# Patient Record
Sex: Female | Born: 1985 | Race: Black or African American | Hispanic: No | Marital: Single | Smoking: Current some day smoker
Health system: Southern US, Community
[De-identification: ages and names within clinical notes are randomized; demographics above are authoritative.]

## PROBLEM LIST (undated history)

## (undated) ENCOUNTER — Inpatient Hospital Stay (HOSPITAL_COMMUNITY): Payer: Self-pay

## (undated) DIAGNOSIS — I10 Essential (primary) hypertension: Secondary | ICD-10-CM

## (undated) DIAGNOSIS — B009 Herpesviral infection, unspecified: Secondary | ICD-10-CM

## (undated) DIAGNOSIS — IMO0002 Reserved for concepts with insufficient information to code with codable children: Secondary | ICD-10-CM

## (undated) DIAGNOSIS — A599 Trichomoniasis, unspecified: Secondary | ICD-10-CM

## (undated) DIAGNOSIS — Z8619 Personal history of other infectious and parasitic diseases: Secondary | ICD-10-CM

## (undated) DIAGNOSIS — N764 Abscess of vulva: Secondary | ICD-10-CM

## (undated) DIAGNOSIS — R87629 Unspecified abnormal cytological findings in specimens from vagina: Secondary | ICD-10-CM

## (undated) DIAGNOSIS — R87619 Unspecified abnormal cytological findings in specimens from cervix uteri: Secondary | ICD-10-CM

## (undated) DIAGNOSIS — K219 Gastro-esophageal reflux disease without esophagitis: Secondary | ICD-10-CM

## (undated) HISTORY — PX: DILATION AND CURETTAGE OF UTERUS: SHX78

## (undated) HISTORY — DX: Herpesviral infection, unspecified: B00.9

## (undated) HISTORY — DX: Personal history of other infectious and parasitic diseases: Z86.19

## (undated) HISTORY — DX: Abscess of vulva: N76.4

## (undated) HISTORY — DX: Reserved for concepts with insufficient information to code with codable children: IMO0002

## (undated) HISTORY — DX: Unspecified abnormal cytological findings in specimens from cervix uteri: R87.619

## (undated) HISTORY — DX: Unspecified abnormal cytological findings in specimens from vagina: R87.629

---
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
## 2006-03-04 ENCOUNTER — Emergency Department (HOSPITAL_COMMUNITY): Admission: EM | Admit: 2006-03-04 | Discharge: 2006-03-04 | Payer: Self-pay | Admitting: Emergency Medicine

## 2006-03-05 ENCOUNTER — Emergency Department (HOSPITAL_COMMUNITY): Admission: EM | Admit: 2006-03-05 | Discharge: 2006-03-05 | Payer: Self-pay | Admitting: Emergency Medicine

## 2007-10-27 ENCOUNTER — Emergency Department (HOSPITAL_COMMUNITY): Admission: EM | Admit: 2007-10-27 | Discharge: 2007-10-27 | Payer: Self-pay | Admitting: Emergency Medicine

## 2007-12-26 ENCOUNTER — Other Ambulatory Visit: Admission: RE | Admit: 2007-12-26 | Discharge: 2007-12-26 | Payer: Self-pay | Admitting: Obstetrics and Gynecology

## 2008-10-15 ENCOUNTER — Inpatient Hospital Stay (HOSPITAL_COMMUNITY): Admission: AD | Admit: 2008-10-15 | Discharge: 2008-10-15 | Payer: Self-pay | Admitting: Obstetrics & Gynecology

## 2008-10-18 ENCOUNTER — Inpatient Hospital Stay (HOSPITAL_COMMUNITY): Admission: AD | Admit: 2008-10-18 | Discharge: 2008-10-18 | Payer: Self-pay | Admitting: Obstetrics & Gynecology

## 2008-10-21 ENCOUNTER — Inpatient Hospital Stay (HOSPITAL_COMMUNITY): Admission: AD | Admit: 2008-10-21 | Discharge: 2008-10-21 | Payer: Self-pay | Admitting: Obstetrics & Gynecology

## 2008-10-28 ENCOUNTER — Inpatient Hospital Stay (HOSPITAL_COMMUNITY): Admission: AD | Admit: 2008-10-28 | Discharge: 2008-10-28 | Payer: Self-pay | Admitting: Obstetrics & Gynecology

## 2008-11-04 ENCOUNTER — Inpatient Hospital Stay (HOSPITAL_COMMUNITY): Admission: AD | Admit: 2008-11-04 | Discharge: 2008-11-04 | Payer: Self-pay | Admitting: Obstetrics & Gynecology

## 2008-11-11 ENCOUNTER — Inpatient Hospital Stay (HOSPITAL_COMMUNITY): Admission: AD | Admit: 2008-11-11 | Discharge: 2008-11-11 | Payer: Self-pay | Admitting: Obstetrics & Gynecology

## 2008-11-25 ENCOUNTER — Inpatient Hospital Stay (HOSPITAL_COMMUNITY): Admission: AD | Admit: 2008-11-25 | Discharge: 2008-11-25 | Payer: Self-pay | Admitting: Obstetrics & Gynecology

## 2009-03-11 ENCOUNTER — Other Ambulatory Visit: Admission: RE | Admit: 2009-03-11 | Discharge: 2009-03-11 | Payer: Self-pay | Admitting: Obstetrics and Gynecology

## 2009-05-19 ENCOUNTER — Ambulatory Visit (HOSPITAL_COMMUNITY): Admission: RE | Admit: 2009-05-19 | Discharge: 2009-05-19 | Payer: Self-pay | Admitting: Obstetrics & Gynecology

## 2009-06-30 ENCOUNTER — Ambulatory Visit (HOSPITAL_COMMUNITY): Admission: RE | Admit: 2009-06-30 | Discharge: 2009-06-30 | Payer: Self-pay | Admitting: Obstetrics & Gynecology

## 2009-07-21 ENCOUNTER — Ambulatory Visit (HOSPITAL_COMMUNITY): Admission: RE | Admit: 2009-07-21 | Discharge: 2009-07-21 | Payer: Self-pay | Admitting: Obstetrics & Gynecology

## 2009-08-07 ENCOUNTER — Inpatient Hospital Stay (HOSPITAL_COMMUNITY): Admission: AD | Admit: 2009-08-07 | Discharge: 2009-08-07 | Payer: Self-pay | Admitting: Obstetrics & Gynecology

## 2009-08-09 ENCOUNTER — Ambulatory Visit: Payer: Self-pay | Admitting: Obstetrics and Gynecology

## 2009-08-09 ENCOUNTER — Inpatient Hospital Stay (HOSPITAL_COMMUNITY): Admission: AD | Admit: 2009-08-09 | Discharge: 2009-08-09 | Payer: Self-pay | Admitting: Obstetrics & Gynecology

## 2009-10-01 ENCOUNTER — Inpatient Hospital Stay (HOSPITAL_COMMUNITY): Admission: RE | Admit: 2009-10-01 | Discharge: 2009-10-04 | Payer: Self-pay | Admitting: Obstetrics & Gynecology

## 2010-03-15 ENCOUNTER — Other Ambulatory Visit: Admission: RE | Admit: 2010-03-15 | Discharge: 2010-03-15 | Payer: Self-pay | Admitting: Obstetrics and Gynecology

## 2010-12-02 ENCOUNTER — Emergency Department (HOSPITAL_COMMUNITY): Admission: EM | Admit: 2010-12-02 | Discharge: 2010-02-15 | Payer: Self-pay | Admitting: Emergency Medicine

## 2011-03-31 LAB — CBC
HCT: 28.5 % — ABNORMAL LOW (ref 36.0–46.0)
HCT: 30.8 % — ABNORMAL LOW (ref 36.0–46.0)
HCT: 37.5 % (ref 36.0–46.0)
Hemoglobin: 10.3 g/dL — ABNORMAL LOW (ref 12.0–15.0)
MCHC: 34 g/dL (ref 30.0–36.0)
MCV: 94.3 fL (ref 78.0–100.0)
MCV: 94.6 fL (ref 78.0–100.0)
Platelets: 144 10*3/uL — ABNORMAL LOW (ref 150–400)
Platelets: 146 10*3/uL — ABNORMAL LOW (ref 150–400)
Platelets: 177 10*3/uL (ref 150–400)
RBC: 3.02 MIL/uL — ABNORMAL LOW (ref 3.87–5.11)
WBC: 11.5 10*3/uL — ABNORMAL HIGH (ref 4.0–10.5)

## 2011-03-31 LAB — ABO/RH: ABO/RH(D): B POS

## 2011-04-02 LAB — URINALYSIS, ROUTINE W REFLEX MICROSCOPIC
Bilirubin Urine: NEGATIVE
Glucose, UA: NEGATIVE mg/dL
Ketones, ur: NEGATIVE mg/dL
Nitrite: NEGATIVE
Protein, ur: NEGATIVE mg/dL
Specific Gravity, Urine: 1.015 (ref 1.005–1.030)
pH: 7 (ref 5.0–8.0)

## 2011-04-02 LAB — URINE MICROSCOPIC-ADD ON

## 2011-04-02 LAB — URINE CULTURE: Colony Count: 30000

## 2011-04-27 ENCOUNTER — Emergency Department (HOSPITAL_COMMUNITY)
Admission: EM | Admit: 2011-04-27 | Discharge: 2011-04-27 | Disposition: A | Discharge status: Home / Self Care | Payer: 59 | Attending: Emergency Medicine | Admitting: Emergency Medicine

## 2011-04-27 DIAGNOSIS — J020 Streptococcal pharyngitis: Secondary | ICD-10-CM | POA: Insufficient documentation

## 2011-05-13 NOTE — Consult Note (Signed)
Jamie [name], REAUME NO.:  [id number]   MEDICAL RECORD NO.:  [id number]          PATIENT TYPE:  EMS   LOCATION:  ED                            FACILITY:  [facility]   PHYSICIAN:  Tilda Burrow, M.D. DATE OF BIRTH:  1986-09-07   DATE OF CONSULTATION:  DATE OF DISCHARGE:  03/05/2006                                   CONSULTATION   [place]   CHIEF COMPLAINT:  Bleeding at [redacted] weeks gestation.   HISTORY OF PRESENT ILLNESS:  This 25 year old female gravida 4, para 1,  elected AB x2, 1 prior successful vaginal delivery in 2004 with elective  terminations of pregnancy once previously and once after that pregnancy, is  seen in the emergency room complaining of bleeding.  She has known that she  was pregnant since mid February, as her last menstrual period was late  January.  She has not sought pregnancy care.  She goes to Dr. Gerda Diss for  general medical care.  She presents for the second time in 24 hours  complaining of a vaginal bleeding per ER records.   PAST MEDICAL HISTORY:  Benign.   SURGICAL HISTORY:  Negative.  Review of old records indicate the patient is  blood type B positive.   EXAMINATION:  SPECULUM EXAM:  Heavy dark blood in the vaginal vault with  some obvious tissue in the vault.  This is extracted and sent as a specimen.  The cervix is closed and the uterus is anteflexed within upper limits normal  size.   Transvaginal ultrasound was performed by Jannifer Franklin, M.D. in the ER,  revealing a normal size uterus with a 9 mm endometrial stripe.  There is  some thickening of the tissue in the mid portion of the endometrium.  It  appears that there is very little tissue in the uppermost portions of the  uterine fundus.  It is my impression that she has already separated off most  of the pregnancy tissue, but there is plenty of decidual left inside the  uterus.  There may be some residual placental tissue as well.   Options discussed with patient.  Option proceeding with a D&C in the a.m. to  completely evacuate uterus discussed with patient versus letting her  spontaneously expel the remaining tissue, which is a relatively small  amount.  The patient feels she can comfortably handle spontaneous  miscarriage completion at home.  She is, therefore, given Tylenol #3  prescription 1 tablet to 2 tablets every 4 hours p.r.n. pain, also advised  to take doxycycline 100 mg p.o. b.i.d. x3 days.  Followup in 3 days at  [facility] for reassessment.  If resolution of bleeding has not  occurred, the patient may be considered at that time for a D&C.   ADDENDUM:  The patient desires to use Depo-Provera contraception in the  future and may receive her first shot at followup if needed.      Tilda Burrow, M.D.  Electronically Signed     JVF/MEDQ  D:  03/05/2006  T:  03/06/2006  Job:  [id number]  cc:   [facility]  [address]  [place], [place] [address]   Scott A. Gerda Diss, MD  Fax: [phone]

## 2011-05-13 NOTE — Op Note (Signed)
NAME:  Jamie [name], Jamie [name]                    ACCOUNT NO.:  [id number]   MEDICAL RECORD NO.:  [id number]                   PATIENT TYPE:  INP   LOCATION:  A427                                 FACILITY:  [facility]   PHYSICIAN:  Lazaro Arms, M.D.                DATE OF BIRTH:  12-Jun-1986   DATE OF PROCEDURE:  09/19/2003  DATE OF DISCHARGE:                                 OPERATIVE REPORT   INDICATIONS FOR PROCEDURE:  We were called on a stat basis to [facility]  labor and delivery room because of fetal heart rate that went down into the  50s to 70 range and remained down there.  It responded to change in position  and O2 and fluids and cutting off the Pitocin; however, it dropped back down  again into that low range and stayed there.  The baby was at +2 station, and  I tried and vacuum and forceps delivery for approximately 10 minutes.  This  was approximately at 10 minutes to 4 p.m. or 1550.  When I was unable to  deliver the baby vaginally, I made the decision to do a stat C-section.  The  patient had an epidural which was working well.  We took her down to the  operating room and splashed Betadine on her belly and did the C-section in a  stat fashion right there in the labor and delivery bed.   Again, I was called and basically arrived at 1550.  We went down to the OR  at 1600, and the infant was delivered at 1615, with Dr. Milford Cage present.   PREOPERATIVE DIAGNOSES:  1. Intrauterine pregnancy at 39-weeks gestation.  2. Chronic hypertension.  3. Unresponsive fetal bradycardic episode in the 50-70 range.  4. Failed vacuum with forceps vaginal delivery attempt.   POSTOPERATIVE DIAGNOSES:  1. Intrauterine pregnancy at 39-weeks gestation.  2. Chronic hypertension.  3. Unresponsive fetal bradycardic episode in the 50-70 range.  4. Failed vacuum with forceps vaginal delivery attempt.  5. Occiput posterior presentation.   PROCEDURE:  Stat cesarean section.   SURGEON:  Lazaro Arms,  M.D.   ANESTHESIA:  Epidural.   FINDINGS:  As above.   DESCRIPTION OF PROCEDURE:  Again, the patient was taken down in stat fashion  to the operating room.  Betadine was splashed on the belly.   A Pfannenstiel skin incision was made and carried down sharply to the rectus  fascia, scored in the midline, and extended laterally.  A uterine incision  was then made after the peritoneal cavity was entered, and the infant was  delivered at 1615 with Apgars of 5, 7, and 9.  The cord gas and cord blood  were sent.  Cord gas returned showing basically a pure respiratory  component.  The pH was 7.07, PCO2 72, bicarb 20, and a base deficit of 8.7  which points to a respiratory acidosis or an acute acidotic state.  We  then  draped the patient out after this point and gave her Ancef IV.  The uterus  was closed in two layers, the first being a running interlocking layer and  the second being an imbricating layer.  The placenta was sent to pathology  for evaluation.  There was good hemostasis.  The uterus was replaced in the  peritoneal cavity.  Both pericolic gutters were irrigated.  The muscles were  reapproximated loosely.  The fascia was closed using 0 Vicryl running.  The  subcutaneous tissue was irrigated and made hemostatic.  The skin was closed  using skin staples.   The patient tolerated the procedure well.  She experienced 500 cc of blood  loss and was taken to the recovery room in good and stable condition.  All  counts were correct.      ___________________________________________                                            Lazaro Arms, M.D.   LHE/MEDQ  D:  10/30/2003  T:  10/30/2003  Job:  [id number]

## 2011-05-13 NOTE — Op Note (Signed)
   NAME:  Jamie [name], Jamie [name]                    ACCOUNT NO.:  [id number]   MEDICAL RECORD NO.:  [id number]                   PATIENT TYPE:  INP   LOCATION:  LDR1                                 FACILITY:  [facility]   PHYSICIAN:  Lazaro Arms, M.D.                DATE OF BIRTH:  1986/04/07   DATE OF PROCEDURE:  09/19/2003  DATE OF DISCHARGE:                                 OPERATIVE REPORT   PROCEDURE:  Epidural.   INDICATIONS FOR PROCEDURE:  Jamie [name] is a gravida 1, para 0 at [redacted] weeks  gestation with chronic hypertension.  She is in early labor and requesting  an epidural.   DESCRIPTION OF PROCEDURE:  The patient was placed in the seating position.  Her iliac crest were identified.  The spinous processes were identified.  The L2-L3 interspace was identified and marked.  The area was prepped and  draped.  Lidocaine 1% was used as a local anesthetic.  A 17-gauge Tuohy  needle was used and a loss of resistance technique employed.  With one pass,  the epidural space was found without difficulty, and 10 cc of 0.125%  bupivacaine was given through the needle as a test dose with no ill effects.  The epidural catheter was threaded easily 5 cm from the epidural space.  An  additional 10 cc of 0.125% bupivacaine and 5 cc of 1.5% epinephrine with  1:200,000 epinephrine was given to dose up the epidural.  It was then taped  down 5 cm from the epidural space or 12 cm at the skin.  The patient  tolerated the procedure well.  She was having no ill effects and is  comfortable.  The blood pressure is stable, and the fetal heart rate tracing  is reactive.      ___________________________________________                                            Lazaro Arms, M.D.   LHE/MEDQ  D:  09/19/2003  T:  09/19/2003  Job:  [id number]

## 2011-05-13 NOTE — H&P (Signed)
   NAME:  [name], Jamie NO.:  [id number]   MEDICAL RECORD NO.:  [id number]                   PATIENT TYPE:   LOCATION:                                       FACILITY:   PHYSICIAN:  Lazaro Arms, M.D.                DATE OF BIRTH:   DATE OF ADMISSION:  09/18/2003  DATE OF DISCHARGE:                                HISTORY & PHYSICAL   HISTORY OF PRESENT ILLNESS:  Jamie [name] is a 25 year old African American  female, gravida 2, para 0, abortion 1, with estimated date of delivery of  September 24, 2003 by last menstrual period and confirmatory ten week  sonogram, currently at 39-weeks gestation, who is admitted for induction of  labor.  She has chronic hypertension and has been on blood pressure medicine  almost the entire pregnancy beginning April 08, 2003.  She currently is on  Aldomet 500 mg b.i.d. She has been getting twice weekly antepartum fetal  testing with good results.  Today, her cervix is 2- to -3-cm, 50% effaced,  and -2 station, vertex, soft in mid plane.  Preparations were made for Foley  bulb insertion and induction this coming Thursday and Friday.   PAST MEDICAL HISTORY:  Chronic hypertension.   PAST SURGICAL HISTORY:  Negative.   PAST OB HISTORY:  Pregnancy loss.   ALLERGIES:  None.   MEDICATIONS:  Prenatal vitamins.   Blood type is B positive.  Antibody screen is negative.  Rubella is immune.  Hepatitis B is negative.  HIV is negative.  Serology is nonreactive.  Pap  was normal.  GC and Chlamydia were negative.  AFP was normal.  Group B strep  was negative.  Glucola was 71.   The patient is indeed interested in an epidural for pain management.   PHYSICAL EXAMINATION:  HEENT:  Unremarkable.  Thyroid is normal.  LUNGS:  Clear.  HEART:  Regular rhythm without regurg or gallop.  BREASTS:  Deferred.  ABDOMEN:  Fundal height of 37-cm.  Cervix is as above.  EXTREMITIES:  Have 1+ edema.  NEUROLOGIC:  Grossly intact.   IMPRESSION:  1. Intrauterine pregnancy at 39-weeks gestation.  2.     Chronic hypertension.  3. Favorable cervix.   PLAN:  Patient admitted for cervical ripening, induction of labor.  She will  be there on the 23rd at 5 p.m.     ___________________________________________                                         Lazaro Arms, M.D.   LHE/MEDQ  D:  09/16/2003  T:  09/16/2003  Job:  [id number]

## 2011-05-13 NOTE — Discharge Summary (Signed)
NAME:  Jamie [name], Jamie [name]                    ACCOUNT NO.:  [id number]   MEDICAL RECORD NO.:  [id number]                   PATIENT TYPE:  INP   LOCATION:  A427                                 FACILITY:  [facility]   PHYSICIAN:  Lazaro Arms, M.D.                DATE OF BIRTH:  03-06-1986   DATE OF ADMISSION:  09/18/2003  DATE OF DISCHARGE:  09/23/2003                                 DISCHARGE SUMMARY   DISCHARGE DIAGNOSES:  1. Status post an emergency cesarean section.  2. Fetal bradycardic episode.  3. Anemia.  4. Failed vaginal forceps in vacuum extractor delivery.   PROCEDURES:  1. Induction of labor due to chronic hypertension at 39 weeks.  2. Failed operative vaginal delivery.  3. Stat C-section.   HISTORY AND PHYSICAL:  Please refer to the History and Physical for details  of admission to the hospital.   HOSPITAL COURSE:  The patient was admitted to undergo Foley bulb dilation of  the cervix and induction of and induction of labor caused chronic  hypertension at 39 weeks.  The induction went well without any problems.  Fetal bradycardic episode at about 1540 on September 19, 2003.  The patient  underwent an attempted vaginal delivery without success.  Taken down for a  stat C-section.  Delivered a viable female infant with Apgars of 5, 7 and 9.  Dr. Milford Cage was in attendance.  The patient's postoperative course was  remarkably unremarkable considering she really did not have a full prep.  She remained afebrile, tolerated clear liquids and a regular diet.  Voided  without symptoms.  She was more anemic than I expected.  She dropped down to  7 and 20.8 by postop day #2 and 6.8 to 20.4 on postop day #3.  However, she  was tolerating it.  She was ambulatory.  Her incision was clean, dry and  intact.  She was requiring minimal pain medications.  The infant had a  forceps abrasion on the left cheek area and had what was thought to be third  and seventh nerve weakness after delivery  that was showing some signs of  resolution.  Also, had some facial edema on the left, possibly from  positioning in the pelvis.  The infant, otherwise, did quite well.  The  patient was discharged to home postop day #3 in good, stable condition.   FOLLOWUP:  Follow up in the office in three weeks.  She was given  instructions and precautions for return.   MEDICATIONS:  1. Levaquin as antibiotics.  2. Tylox for pain.     ___________________________________________                                         Lazaro Arms, M.D.   LHE/MEDQ  D:  10/30/2003  T:  10/30/2003  Job:  [id number]  

## 2011-09-27 LAB — HCG, QUANTITATIVE, PREGNANCY
hCG, Beta Chain, Quant, S: 1291 — ABNORMAL HIGH
hCG, Beta Chain, Quant, S: 2014 — ABNORMAL HIGH
hCG, Beta Chain, Quant, S: 11 — ABNORMAL HIGH
hCG, Beta Chain, Quant, S: 45 — ABNORMAL HIGH

## 2012-01-17 ENCOUNTER — Other Ambulatory Visit: Payer: Self-pay

## 2012-01-17 ENCOUNTER — Other Ambulatory Visit (HOSPITAL_COMMUNITY)
Admission: RE | Admit: 2012-01-17 | Discharge: 2012-01-17 | Disposition: A | Discharge status: Home / Self Care | Payer: 59 | Source: Ambulatory Visit | Attending: Unknown Physician Specialty | Admitting: Unknown Physician Specialty

## 2012-01-17 DIAGNOSIS — N870 Mild cervical dysplasia: Secondary | ICD-10-CM | POA: Insufficient documentation

## 2012-01-17 DIAGNOSIS — B977 Papillomavirus as the cause of diseases classified elsewhere: Secondary | ICD-10-CM | POA: Insufficient documentation

## 2012-01-17 DIAGNOSIS — Z01419 Encounter for gynecological examination (general) (routine) without abnormal findings: Secondary | ICD-10-CM | POA: Insufficient documentation

## 2012-03-20 ENCOUNTER — Encounter (HOSPITAL_COMMUNITY): Payer: Self-pay | Admitting: *Deleted

## 2012-03-20 ENCOUNTER — Emergency Department (HOSPITAL_COMMUNITY)
Admission: EM | Admit: 2012-03-20 | Discharge: 2012-03-20 | Disposition: A | Discharge status: Home / Self Care | Payer: 59 | Attending: Emergency Medicine | Admitting: Emergency Medicine

## 2012-03-20 DIAGNOSIS — R197 Diarrhea, unspecified: Secondary | ICD-10-CM | POA: Insufficient documentation

## 2012-03-20 DIAGNOSIS — R112 Nausea with vomiting, unspecified: Secondary | ICD-10-CM | POA: Insufficient documentation

## 2012-03-20 DIAGNOSIS — F17200 Nicotine dependence, unspecified, uncomplicated: Secondary | ICD-10-CM | POA: Insufficient documentation

## 2012-03-20 LAB — PREGNANCY, URINE: Preg Test, Ur: NEGATIVE

## 2012-03-20 LAB — URINALYSIS, ROUTINE W REFLEX MICROSCOPIC
Leukocytes, UA: NEGATIVE
Protein, ur: NEGATIVE mg/dL
Urobilinogen, UA: 0.2 mg/dL (ref 0.0–1.0)

## 2012-03-20 MED ORDER — ONDANSETRON 8 MG PO TBDP
8.0000 mg | ORAL_TABLET | ORAL | Status: AC
  Administered 2012-03-20: 8 mg via ORAL
  Filled 2012-03-20: qty 1

## 2012-03-20 MED ORDER — ONDANSETRON HCL 4 MG PO TABS: 4.0000 mg | ORAL_TABLET | Freq: Four times a day (QID) | ORAL | Status: AC

## 2012-03-20 NOTE — ED Provider Notes (Signed)
History   This chart was scribed for Jamie Chick, MD by Brooks Sailors. The patient was seen in room APA03/APA03. Patient's care was started at 1235.   CSN: [id number]  Arrival date & time 03/20/12  1235   First MD Initiated Contact with Patient 03/20/12 1402      Chief Complaint  Patient presents with  . Nausea  . Emesis  . Diarrhea    (Consider location/radiation/quality/duration/timing/severity/associated sxs/prior treatment) HPI  Jamie [name] is a 26 y.o. female who presents to the Emergency Department complaining of constant moderate n/v/d onset this morning 6 hours ago and improving since with associated chills. Describes emesis as watery and clear. Denies fever. Patient notes contact with sick family exhibiting similar symptoms. Patient is smoker 0.5 packs a day.  Emesis nonbloody and nonbilious, no blood in stool.  Denies abdominal pain.  There are no other associated systemic symptoms.  Pt has been able to tolerate sprite prior to ED evaluation.  There are no alleviating or modifying factors.      History reviewed. No pertinent past medical history.  History reviewed. No pertinent past surgical history.  No family history on file.  History  Substance Use Topics  . Smoking status: Current Everyday Smoker -- 0.5 packs/day    Types: Cigarettes  . Smokeless tobacco: Not on file  . Alcohol Use: Yes     occasionally    OB History    Grav Para Term Preterm Abortions TAB SAB Ect Mult Living                  Review of Systems 10 Systems reviewed and are negative for acute change except as noted in the HPI.  Allergies  Review of patient's allergies indicates no known allergies.  Home Medications   Current Outpatient Rx  Name Route Sig Dispense Refill  . MICONAZOLE NITRATE 1200-2 MG-% VA KIT Vaginal Place 1 each vaginally once.    Marland Kitchen ONDANSETRON HCL 4 MG PO TABS Oral Take 1 tablet (4 mg total) by mouth every 6 (six) hours. 12 tablet 0    BP 117/76   Pulse 107  Temp(Src) 98.5 F (36.9 C) (Oral)  Resp 16  Ht 5\' 1"  (1.549 m)  Wt 152 lb (68.947 kg)  BMI 28.72 kg/m2  SpO2 100%  LMP 02/14/2012  Physical Exam  Nursing note and vitals reviewed. Constitutional: She is oriented to person, place, and time. She appears well-developed and well-nourished. No distress.  HENT:  Head: Normocephalic and atraumatic.       Tongue mild dryness  Eyes: EOM are normal. Pupils are equal, round, and reactive to light.  Neck: Neck supple. No tracheal deviation present.  Cardiovascular: Normal rate, regular rhythm and normal heart sounds.  Exam reveals no gallop and no friction rub.   No murmur heard. Pulmonary/Chest: Effort normal. No respiratory distress. She has no wheezes. She has no rales.  Abdominal: Soft. Bowel sounds are normal. She exhibits no distension. There is no tenderness.  Musculoskeletal: Normal range of motion. She exhibits no edema.  Neurological: She is alert and oriented to person, place, and time. No sensory deficit.  Skin: Skin is warm and dry.  Psychiatric: She has a normal mood and affect. Her behavior is normal.    ED Course  Procedures (including critical care time) DIAGNOSTIC STUDIES: Oxygen Saturation is 100% on room air, normal by my interpretation.    COORDINATION OF CARE: 2:14PM Patient informed of current plan for treatment and evaluation and  agrees with plan at this time.     Labs Reviewed  URINALYSIS, ROUTINE W REFLEX MICROSCOPIC  PREGNANCY, URINE   No results found.   1. Nausea vomiting and diarrhea       MDM  Patient presenting with acute onset of nausea vomiting and diarrhea this morning. She has no abdominal pain and abdominal exam is benign. She is tolerating Sprite prior to arrival in the ED period her urinalysis is reassuring. She was given a DT Zofran in the ED with improvement of symptoms. She is drinking fluids in the ED as well. She was discharged with strict return precautions and patient is  agreeable with this plan. Prior chart was reviewed and considered during this visit.    I personally performed the services described in this documentation, which was scribed in my presence. The recorded information has been reviewed and considered.       Jamie Chick, MD 03/20/12 [phone]

## 2012-03-20 NOTE — ED Notes (Signed)
Pt presents with c/o N/V/Diarrhea since this morning. Pt denies fever at this time. No acute distress noted.

## 2012-03-20 NOTE — ED Notes (Signed)
C/o n/v/d x 1 day. Reports that she has not vomited in last several hours, but that the diarrhea is still occuring. Patient informed urine specimen is needed.

## 2012-03-20 NOTE — Discharge Instructions (Signed)
Return to the ED with any concerns including abdominal pain, fever, vomiting and not able to keep down liquids, decreased level of alertness or lethargy, fainting, or any other alarming symptoms.

## 2012-04-06 ENCOUNTER — Emergency Department (HOSPITAL_COMMUNITY)
Admission: EM | Admit: 2012-04-06 | Discharge: 2012-04-06 | Disposition: A | Discharge status: Home / Self Care | Payer: 59 | Attending: Emergency Medicine | Admitting: Emergency Medicine

## 2012-04-06 DIAGNOSIS — R197 Diarrhea, unspecified: Secondary | ICD-10-CM

## 2012-04-06 DIAGNOSIS — R1110 Vomiting, unspecified: Secondary | ICD-10-CM | POA: Insufficient documentation

## 2012-04-06 LAB — URINE MICROSCOPIC-ADD ON

## 2012-04-06 LAB — URINALYSIS, ROUTINE W REFLEX MICROSCOPIC
Bilirubin Urine: NEGATIVE
Ketones, ur: NEGATIVE mg/dL
Nitrite: NEGATIVE
Urobilinogen, UA: 0.2 mg/dL (ref 0.0–1.0)

## 2012-04-06 MED ORDER — DIPHENOXYLATE-ATROPINE 2.5-0.025 MG PO TABS
2.0000 | ORAL_TABLET | Freq: Once | ORAL | Status: AC
  Administered 2012-04-06: 2 via ORAL
  Filled 2012-04-06: qty 2

## 2012-04-06 MED ORDER — ONDANSETRON HCL 4 MG/2ML IJ SOLN
4.0000 mg | Freq: Once | INTRAMUSCULAR | Status: AC
  Administered 2012-04-06: 4 mg via INTRAVENOUS
  Filled 2012-04-06: qty 2

## 2012-04-06 MED ORDER — MORPHINE SULFATE 2 MG/ML IJ SOLN
2.0000 mg | Freq: Once | INTRAMUSCULAR | Status: AC
  Administered 2012-04-06: 2 mg via INTRAVENOUS
  Filled 2012-04-06: qty 1

## 2012-04-06 MED ORDER — SODIUM CHLORIDE 0.9 % IV SOLN
Freq: Once | INTRAVENOUS | Status: AC
  Administered 2012-04-06: 03:00:00 via INTRAVENOUS

## 2012-04-06 MED ORDER — SODIUM CHLORIDE 0.9 % IV BOLUS (SEPSIS)
1000.0000 mL | Freq: Once | INTRAVENOUS | Status: AC
  Administered 2012-04-06: 1000 mL via INTRAVENOUS

## 2012-04-06 MED ORDER — PROMETHAZINE HCL 25 MG PO TABS: 12.5000 mg | ORAL_TABLET | Freq: Four times a day (QID) | ORAL | Status: DC | PRN

## 2012-04-06 NOTE — Discharge Instructions (Signed)
Drink lots of fluids.  Bland diet for the next 6-8 hours then progress as you can tolerate. Use the nausea medicine as needed. Use BRAT for diarrhea. Follow up with your doctor.   B.R.A.T. Diet Your doctor has recommended the B.R.A.T. diet for you or your child until the condition improves. This is often used to help control diarrhea and vomiting symptoms. If you or your child can tolerate clear liquids, you may have:  Bananas.   Rice.   Applesauce.   Toast (and other simple starches such as crackers, potatoes, noodles).  Be sure to avoid dairy products, meats, and fatty foods until symptoms are better. Fruit juices such as apple, grape, and prune juice can make diarrhea worse. Avoid these. Continue this diet for 2 days or as instructed by your caregiver. Document Released: 12/12/2005 Document Revised: 12/01/2011 Document Reviewed: 05/31/2007 [employer] Patient Information 2012 [employer]. 

## 2012-04-06 NOTE — ED Provider Notes (Signed)
History     CSN: [id number]  Arrival date & time 04/06/12  0117   First MD Initiated Contact with Patient 04/06/12 0153      Chief Complaint  Patient presents with  . Emesis  . Diarrhea    (Consider location/radiation/quality/duration/timing/severity/associated sxs/prior treatment) HPI Jamie [name] is a 26 y.o. female who presents to the Emergency Department complaining of vomiting and diarrhea that began early yesterday morning. She only had 2 episodes of vomiting but has had multiple episodes of diarrhea. Diarrhea is associated with some lower abdominal cramping. She denies fever, chills, shortness of breath, cough, recent antibiotic therapy. She has taken no medicines for the current condition. No past medical history on file.  No past surgical history on file.  No family history on file.  History  Substance Use Topics  . Smoking status: Current Everyday Smoker -- 0.5 packs/day    Types: Cigarettes  . Smokeless tobacco: Not on file  . Alcohol Use: Yes     occasionally    OB History    Grav Para Term Preterm Abortions TAB SAB Ect Mult Living                  Review of Systems ROS: Statement: All systems negative except as marked or noted in the HPI; Constitutional: Negative for fever and chills. ; ; Eyes: Negative for eye pain, redness and discharge. ; ; ENMT: Negative for ear pain, hoarseness, nasal congestion, sinus pressure and sore throat. ; ; Cardiovascular: Negative for chest pain, palpitations, diaphoresis, dyspnea and peripheral edema. ; ; Respiratory: Negative for cough, wheezing and stridor. ; ; Gastrointestinal: positive for  nausea, vomiting, diarrhea,   abdominal pain, negative for  blood in stool, hematemesis, jaundice and rectal bleeding. . ; ; Genitourinary: Negative for dysuria, flank pain and hematuria. ; ; Musculoskeletal: Negative for back pain and neck pain. Negative for swelling and trauma.; ; Skin: Negative for pruritus, rash, abrasions,  blisters, bruising and skin lesion.; ; Neuro: Negative for headache, lightheadedness and neck stiffness. Negative for weakness, altered level of consciousness , altered mental status, extremity weakness, paresthesias, involuntary movement, seizure and syncope.     Allergies  Review of patient's allergies indicates no known allergies.  Home Medications   Current Outpatient Rx  Name Route Sig Dispense Refill  . MICONAZOLE NITRATE 1200-2 MG-% VA KIT Vaginal Place 1 each vaginally once.      BP 126/80  Pulse 94  Temp(Src) 99.4 F (37.4 C) (Oral)  Resp 20  Ht 5\' 1"  (1.549 m)  Wt 150 lb (68.04 kg)  BMI 28.34 kg/m2  SpO2 96%  LMP 03/26/2012  Physical Exam Physical examination:  Nursing notes reviewed; Vital signs and O2 SAT reviewed;  Constitutional: Well developed, Well nourished, Well hydrated, In no acute distress; Head:  Normocephalic, atraumatic; Eyes: EOMI, PERRL, No scleral icterus; ENMT: Mouth and pharynx normal, Mucous membranes moist; Neck: Supple, Full range of motion, No lymphadenopathy; Cardiovascular: Regular rate and rhythm, No murmur, rub, or gallop; Respiratory: Breath sounds clear & equal bilaterally, No rales, rhonchi, wheezes, or rub, Normal respiratory effort/excursion; Chest: Nontender, Movement normal; Abdomen: Soft, Nontender, Nondistended, Normal bowel sounds; Genitourinary: No CVA tenderness; Extremities: Pulses normal, No tenderness, No edema, No calf edema or asymmetry.; Neuro: AA&Ox3, Major CN grossly intact.  No gross focal motor or sensory deficits in extremities.; Skin: Color normal, Warm, Dry  ED Course  Procedures (including critical care time) Results for orders placed during the hospital encounter of 03/20/12  URINALYSIS,  ROUTINE W REFLEX MICROSCOPIC      Component Value Range   Color, Urine YELLOW  YELLOW    APPearance CLEAR  CLEAR    Specific Gravity, Urine 1.025  1.005 - 1.030    pH 6.0  5.0 - 8.0    Glucose, UA NEGATIVE  NEGATIVE (mg/dL)   Hgb  urine dipstick NEGATIVE  NEGATIVE    Bilirubin Urine NEGATIVE  NEGATIVE    Ketones, ur NEGATIVE  NEGATIVE (mg/dL)   Protein, ur NEGATIVE  NEGATIVE (mg/dL)   Urobilinogen, UA 0.2  0.0 - 1.0 (mg/dL)   Nitrite NEGATIVE  NEGATIVE    Leukocytes, UA NEGATIVE  NEGATIVE   PREGNANCY, URINE      Component Value Range   Preg Test, Ur NEGATIVE  NEGATIVE       MDM  Patient with vomiting and diarrhea that began yesterday morning. Given IV fluids, antibiotics, analgesics, Lomotil with improvement. Patient able to take by mouth fluids.no diarrhea since arrival in the ER.Pt stable in ED with no significant deterioration in condition.The patient appears reasonably screened and/or stabilized for discharge and I doubt any other medical condition or other [facility] requiring further screening, evaluation, or treatment in the ED at this time prior to discharge.  MDM Reviewed: nursing note and vitals Interpretation: labs           Nicoletta Dress. Colon Branch, MD 04/06/12 [phone]

## 2012-04-06 NOTE — ED Notes (Signed)
Vomiting and diarrhea since Thurs am

## 2012-09-02 ENCOUNTER — Encounter (HOSPITAL_COMMUNITY): Payer: Self-pay | Admitting: *Deleted

## 2012-09-02 ENCOUNTER — Emergency Department (HOSPITAL_COMMUNITY)
Admission: EM | Admit: 2012-09-02 | Discharge: 2012-09-02 | Disposition: A | Discharge status: Home / Self Care | Payer: 59 | Attending: Emergency Medicine | Admitting: Emergency Medicine

## 2012-09-02 DIAGNOSIS — F17200 Nicotine dependence, unspecified, uncomplicated: Secondary | ICD-10-CM | POA: Insufficient documentation

## 2012-09-02 DIAGNOSIS — H9209 Otalgia, unspecified ear: Secondary | ICD-10-CM

## 2012-09-02 NOTE — ED Notes (Signed)
Discharge instructions reviewed with pt, questions answered. Pt verbalized understanding.  

## 2012-09-02 NOTE — ED Provider Notes (Signed)
History     CSN: [id number]  Arrival date & time 09/02/12  1227   None     Chief Complaint  Patient presents with  . Sore Throat    (Consider location/radiation/quality/duration/timing/severity/associated sxs/prior treatment) HPI Comments: Sore throat, B ear pain, subjective fever.  No cough.    Patient is a 26 y.o. female presenting with pharyngitis. The history is provided by the patient. No language interpreter was used.  Sore Throat This is a new problem. The problem has been gradually improving. Associated symptoms include a fever and a sore throat. Pertinent negatives include no chills or coughing. The symptoms are aggravated by swallowing. She has tried nothing for the symptoms.    History reviewed. No pertinent past medical history.  History reviewed. No pertinent past surgical history.  No family history on file.  History  Substance Use Topics  . Smoking status: Current Everyday Smoker -- 0.5 packs/day    Types: Cigarettes  . Smokeless tobacco: Not on file  . Alcohol Use: Yes     occasionally    OB History    Grav Para Term Preterm Abortions TAB SAB Ect Mult Living                  Review of Systems  Constitutional: Positive for fever. Negative for chills.  HENT: Positive for ear pain and sore throat.   Respiratory: Negative for cough.   All other systems reviewed and are negative.    Allergies  Review of patient's allergies indicates no known allergies.  Home Medications   Current Outpatient Rx  Name Route Sig Dispense Refill  . PSEUDOEPH-CPM-DM-APAP 30-2-15-325 MG PO TABS Oral Take 2 tablets by mouth every 8 (eight) hours as needed. Cold Symptoms      BP 130/83  Pulse 65  Temp 98.1 F (36.7 C) (Oral)  Resp 18  Ht 5\' 1"  (1.549 m)  Wt 146 lb 8 oz (66.452 kg)  BMI 27.68 kg/m2  SpO2 100%  LMP 08/14/2012  Physical Exam  Nursing note and vitals reviewed. Constitutional: She is oriented to person, place, and time. She appears well-developed  and well-nourished. No distress.  HENT:  Head: Normocephalic and atraumatic.  Right Ear: Hearing, tympanic membrane, external ear and ear canal normal.  Left Ear: Hearing, tympanic membrane, external ear and ear canal normal.  Mouth/Throat: Uvula is midline and mucous membranes are normal. No uvula swelling. Posterior oropharyngeal erythema present. No oropharyngeal exudate, posterior oropharyngeal edema or tonsillar abscesses.  Eyes: EOM are normal.  Neck: Normal range of motion.  Cardiovascular: Normal rate, regular rhythm and normal heart sounds.   Pulmonary/Chest: Breath sounds normal. No accessory muscle usage. Not tachypneic. No respiratory distress. She has no decreased breath sounds. She has no wheezes. She has no rhonchi. She has no rales.  Abdominal: Soft. She exhibits no distension. There is no tenderness.  Musculoskeletal: Normal range of motion.  Lymphadenopathy:       Right cervical: No superficial cervical, no deep cervical and no posterior cervical adenopathy present.      Left cervical: No superficial cervical, no deep cervical and no posterior cervical adenopathy present.  Neurological: She is alert and oriented to person, place, and time.  Skin: Skin is warm and dry.  Psychiatric: She has a normal mood and affect. Judgment normal.    ED Course  Procedures (including critical care time)   Labs Reviewed  RAPID STREP SCREEN   No results found.   1. Ear pain  MDM  Strep neg No s/s of AOM         Evalina Field, PA 09/02/12 1401

## 2012-09-02 NOTE — ED Notes (Signed)
Pt also requesting to have right wrist "checked" pain to right wrist area for a month, denies any injury.

## 2012-09-02 NOTE — ED Notes (Signed)
C/o sore throat for two days, denies any fever, some diarrhea yesterday, but it is better today

## 2012-09-02 NOTE — ED Provider Notes (Signed)
Medical screening examination/treatment/procedure(s) were performed by non-physician practitioner and as supervising physician I was immediately available for consultation/collaboration.   Lyanne Co, MD 09/02/12 [phone]

## 2013-02-08 ENCOUNTER — Emergency Department (HOSPITAL_COMMUNITY)
Admission: EM | Admit: 2013-02-08 | Discharge: 2013-02-08 | Disposition: A | Discharge status: Home / Self Care | Payer: Self-pay | Attending: Emergency Medicine | Admitting: Emergency Medicine

## 2013-02-08 ENCOUNTER — Encounter (HOSPITAL_COMMUNITY): Payer: Self-pay | Admitting: *Deleted

## 2013-02-08 DIAGNOSIS — R599 Enlarged lymph nodes, unspecified: Secondary | ICD-10-CM | POA: Insufficient documentation

## 2013-02-08 DIAGNOSIS — J3489 Other specified disorders of nose and nasal sinuses: Secondary | ICD-10-CM | POA: Insufficient documentation

## 2013-02-08 DIAGNOSIS — R5381 Other malaise: Secondary | ICD-10-CM | POA: Insufficient documentation

## 2013-02-08 DIAGNOSIS — R6883 Chills (without fever): Secondary | ICD-10-CM | POA: Insufficient documentation

## 2013-02-08 DIAGNOSIS — F17200 Nicotine dependence, unspecified, uncomplicated: Secondary | ICD-10-CM | POA: Insufficient documentation

## 2013-02-08 DIAGNOSIS — R059 Cough, unspecified: Secondary | ICD-10-CM | POA: Insufficient documentation

## 2013-02-08 DIAGNOSIS — J029 Acute pharyngitis, unspecified: Secondary | ICD-10-CM | POA: Insufficient documentation

## 2013-02-08 DIAGNOSIS — R05 Cough: Secondary | ICD-10-CM | POA: Insufficient documentation

## 2013-02-08 LAB — RAPID STREP SCREEN (MED CTR MEBANE ONLY): Streptococcus, Group A Screen (Direct): NEGATIVE

## 2013-02-08 MED ORDER — IBUPROFEN 800 MG PO TABS
800.0000 mg | ORAL_TABLET | Freq: Once | ORAL | Status: AC
Start: 2013-02-08 — End: 2013-02-08
  Administered 2013-02-08: 800 mg via ORAL
  Filled 2013-02-08: qty 1

## 2013-02-08 NOTE — ED Notes (Signed)
Pt c/o sore throat and dysphagia x2 days.

## 2013-02-08 NOTE — ED Notes (Signed)
Pt c/o sore throat x 2 days, worse today. 

## 2013-02-10 LAB — STREP A DNA PROBE

## 2013-02-11 NOTE — ED Provider Notes (Signed)
History     CSN: [id number]  Arrival date & time 02/08/13  1131   First MD Initiated Contact with Patient 02/08/13 1153      Chief Complaint  Patient presents with  . Sore Throat    (Consider location/radiation/quality/duration/timing/severity/associated sxs/prior treatment) Patient is a 27 y.o. female presenting with pharyngitis. The history is provided by the patient.  Sore Throat This is a new problem. Episode onset: 2 days ago. The problem occurs constantly. The problem has been unchanged. Associated symptoms include chills, congestion, coughing, fatigue, a sore throat and swollen glands. Pertinent negatives include no abdominal pain, arthralgias, chest pain, fever, headaches, joint swelling, nausea, neck pain, numbness, rash, vomiting or weakness. Nothing aggravates the symptoms. Treatments tried: otc tylenol cold formula. The treatment provided no relief.    History reviewed. No pertinent past medical history.  History reviewed. No pertinent past surgical history.  History reviewed. No pertinent family history.  History  Substance Use Topics  . Smoking status: Current Every Day Smoker -- 0.50 packs/day    Types: Cigarettes  . Smokeless tobacco: Not on file  . Alcohol Use: Yes     Comment: occasionally    OB History   Grav Para Term Preterm Abortions TAB SAB Ect Mult Living                  Review of Systems  Constitutional: Positive for chills and fatigue. Negative for fever.  HENT: Positive for congestion and sore throat. Negative for neck pain.   Eyes: Negative.   Respiratory: Positive for cough. Negative for chest tightness, shortness of breath and wheezing.   Cardiovascular: Negative for chest pain and palpitations.  Gastrointestinal: Negative for nausea, vomiting and abdominal pain.  Genitourinary: Negative.   Musculoskeletal: Negative for joint swelling and arthralgias.  Skin: Negative.  Negative for rash and wound.  Neurological: Negative for dizziness,  weakness, light-headedness, numbness and headaches.  Psychiatric/Behavioral: Negative.     Allergies  Review of patient's allergies indicates no known allergies.  Home Medications   Current Outpatient Rx  Name  Route  Sig  Dispense  Refill  . Pseudoeph-CPM-DM-APAP (TYLENOL COLD) 30-2-15-325 MG TABS   Oral   Take 2 tablets by mouth every 8 (eight) hours as needed. Cold Symptoms           BP 130/84  Pulse 83  Temp(Src) 98.2 F (36.8 C) (Oral)  Ht 5\' 1"  (1.549 m)  Wt 150 lb (68.04 kg)  BMI 28.36 kg/m2  SpO2 100%  LMP 02/08/2013  Physical Exam  Constitutional: She is oriented to person, place, and time. She appears well-developed and well-nourished.  HENT:  Head: Normocephalic and atraumatic.  Right Ear: Tympanic membrane and ear canal normal.  Left Ear: Tympanic membrane and ear canal normal.  Nose: Mucosal edema and rhinorrhea present.  Mouth/Throat: Uvula is midline and mucous membranes are normal. Posterior oropharyngeal erythema present. No oropharyngeal exudate, posterior oropharyngeal edema or tonsillar abscesses.  Eyes: Conjunctivae are normal.  Cardiovascular: Normal rate and normal heart sounds.   Pulmonary/Chest: Effort normal. No respiratory distress. She has no wheezes. She has no rales.  Abdominal: Soft. There is no tenderness.  Musculoskeletal: Normal range of motion.  Neurological: She is alert and oriented to person, place, and time.  Skin: Skin is warm and dry. No rash noted.  Psychiatric: She has a normal mood and affect.    ED Course  Procedures (including critical care time)  Labs Reviewed  RAPID STREP SCREEN  STREP A DNA  PROBE   No results found.   1. Acute viral pharyngitis       MDM  Viral pharyngitis by hx and PE.  Negative strep,  Culture sent.  Encouraged rest, increased fluids, ibuprofen.  Prn f/u, advised recheck here for any worsened sx.  Doubt mono with no lymphadenopathy.          Burgess Amor, [place] 02/11/13 [phone]

## 2013-02-18 NOTE — ED Provider Notes (Signed)
Medical screening examination/treatment/procedure(s) were performed by non-physician practitioner and as supervising physician I was immediately available for consultation/collaboration.  Lillie Bollig M Leanor Voris, MD 02/18/13 1910 

## 2013-06-07 ENCOUNTER — Other Ambulatory Visit: Payer: Self-pay

## 2013-06-11 ENCOUNTER — Other Ambulatory Visit: Payer: Self-pay | Admitting: Obstetrics & Gynecology

## 2013-06-11 DIAGNOSIS — O3680X0 Pregnancy with inconclusive fetal viability, not applicable or unspecified: Secondary | ICD-10-CM

## 2013-06-12 ENCOUNTER — Encounter: Payer: Self-pay | Admitting: *Deleted

## 2013-06-13 ENCOUNTER — Ambulatory Visit (INDEPENDENT_AMBULATORY_CARE_PROVIDER_SITE_OTHER): Payer: Medicaid Other

## 2013-06-13 DIAGNOSIS — O26849 Uterine size-date discrepancy, unspecified trimester: Secondary | ICD-10-CM

## 2013-06-13 DIAGNOSIS — O3680X0 Pregnancy with inconclusive fetal viability, not applicable or unspecified: Secondary | ICD-10-CM

## 2013-06-13 DIAGNOSIS — R1903 Right lower quadrant abdominal swelling, mass and lump: Secondary | ICD-10-CM

## 2013-06-13 NOTE — Progress Notes (Signed)
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

## 2013-06-20 ENCOUNTER — Encounter: Payer: Self-pay | Admitting: Adult Health

## 2013-06-20 ENCOUNTER — Ambulatory Visit (INDEPENDENT_AMBULATORY_CARE_PROVIDER_SITE_OTHER): Payer: Medicaid Other | Admitting: Adult Health

## 2013-06-20 ENCOUNTER — Telehealth: Payer: Self-pay | Admitting: Adult Health

## 2013-06-20 VITALS — BP 120/78 | Wt 154.0 lb

## 2013-06-20 DIAGNOSIS — O210 Mild hyperemesis gravidarum: Secondary | ICD-10-CM

## 2013-06-20 DIAGNOSIS — Z331 Pregnant state, incidental: Secondary | ICD-10-CM

## 2013-06-20 DIAGNOSIS — Z1389 Encounter for screening for other disorder: Secondary | ICD-10-CM

## 2013-06-20 DIAGNOSIS — R110 Nausea: Secondary | ICD-10-CM

## 2013-06-20 DIAGNOSIS — O209 Hemorrhage in early pregnancy, unspecified: Secondary | ICD-10-CM

## 2013-06-20 LAB — POCT URINALYSIS DIPSTICK: Blood, UA: NEGATIVE

## 2013-06-20 NOTE — Patient Instructions (Addendum)
No sex  Follow up as scheduledVaginal Bleeding During Pregnancy, First Trimester A small amount of bleeding (spotting) is relatively common in early pregnancy. It usually stops on its own. There are many causes for bleeding or spotting in early pregnancy. Some bleeding may be related to the pregnancy and some may not. Cramping with the bleeding is more serious and concerning. Tell your caregiver if you have any vaginal bleeding.  CAUSES   It is normal in most cases.  The pregnancy ends (miscarriage).  The pregnancy may end (threatened miscarriage).  Infection or inflammation of the cervix.  Growths (polyps) on the cervix.  Pregnancy happens outside of the uterus and in a fallopian tube (tubal pregnancy).  Many tiny cysts in the uterus instead of pregnancy tissue (molar pregnancy). SYMPTOMS  Vaginal bleeding or spotting with or without cramps. DIAGNOSIS  To evaluate the pregnancy, your caregiver may:  Do a pelvic exam.  Take blood tests.  Do an ultrasound. It is very important to follow your caregiver's instructions.  TREATMENT   Evaluation of the pregnancy with blood tests and ultrasound.  Bed rest (getting up to use the bathroom only).  Rho-gam immunization if the mother is Rh negative and the father is Rh positive. HOME CARE INSTRUCTIONS   If your caregiver orders bed rest, you may need to make arrangements for the care of other children and for other responsibilities. However, your caregiver may allow you to continue light activity.  Keep track of the number of pads you use each day, how often you change pads and how soaked (saturated) they are. Write this down.  Do not use tampons. Do not douche.  Do not have sexual intercourse or orgasms until approved by your physician.  Save any tissue that you pass for your caregiver to see.  Take medicine for cramps only with your caregiver's permission.  Do not take aspirin because it can make you bleed. SEEK IMMEDIATE  MEDICAL CARE IF:   You experience severe cramps in your stomach, back or belly (abdomen).  You have an oral temperature above 102 F (38.9 C), not controlled by medicine.  You pass large clots or tissue.  Your bleeding increases or you become light-headed, weak or have fainting episodes.  You develop chills.  You are leaking or have a gush of fluid from your vagina.  You pass out while having a bowel movement. That may mean you have a ruptured tubal pregnancy. Document Released: 09/21/2005 Document Revised: 03/05/2012 Document Reviewed: 04/02/2009 [facility] Patient Information 2014 [place], [place].

## 2013-06-20 NOTE — Addendum Note (Signed)
Addended by: Richardson Chiquito on: 06/20/2013 11:27 AM   Modules accepted: Orders

## 2013-06-20 NOTE — Progress Notes (Signed)
Pt here today for light spotting last night, not after sex or BM. Pt states she has had some mild period like cramps as well.

## 2013-06-20 NOTE — Progress Notes (Signed)
Bleeding started last night, had some mild cramps today and was better after BM, on exam + blood in vault, - CMT, had fhm on [place] 118, will check QHCG and progesterone and GC/CHL and see 7/3 as scheduled for [place] and new OB visit. No sex or heavy lifting.

## 2013-06-20 NOTE — Telephone Encounter (Signed)
School excuse per Cyril Mourning, NP  left at front desk for pt to pick up.

## 2013-06-21 ENCOUNTER — Telehealth: Payer: Self-pay | Admitting: *Deleted

## 2013-06-21 NOTE — Telephone Encounter (Signed)
Please review labs and let me know if you want me to call pt. Thanks!!! [employer]

## 2013-06-21 NOTE — Telephone Encounter (Signed)
Pt aware of labs  

## 2013-06-26 ENCOUNTER — Other Ambulatory Visit: Payer: Self-pay | Admitting: Obstetrics & Gynecology

## 2013-06-26 DIAGNOSIS — R1903 Right lower quadrant abdominal swelling, mass and lump: Secondary | ICD-10-CM

## 2013-06-27 ENCOUNTER — Encounter: Payer: Self-pay | Admitting: Adult Health

## 2013-06-27 ENCOUNTER — Ambulatory Visit (INDEPENDENT_AMBULATORY_CARE_PROVIDER_SITE_OTHER): Payer: Medicaid Other | Admitting: Adult Health

## 2013-06-27 ENCOUNTER — Ambulatory Visit (INDEPENDENT_AMBULATORY_CARE_PROVIDER_SITE_OTHER): Payer: Medicaid Other

## 2013-06-27 VITALS — BP 112/60 | Wt 154.0 lb

## 2013-06-27 DIAGNOSIS — O99891 Other specified diseases and conditions complicating pregnancy: Secondary | ICD-10-CM

## 2013-06-27 DIAGNOSIS — R110 Nausea: Secondary | ICD-10-CM

## 2013-06-27 DIAGNOSIS — N760 Acute vaginitis: Secondary | ICD-10-CM | POA: Insufficient documentation

## 2013-06-27 DIAGNOSIS — R1903 Right lower quadrant abdominal swelling, mass and lump: Secondary | ICD-10-CM

## 2013-06-27 DIAGNOSIS — Z87898 Personal history of other specified conditions: Secondary | ICD-10-CM | POA: Insufficient documentation

## 2013-06-27 DIAGNOSIS — O34219 Maternal care for unspecified type scar from previous cesarean delivery: Secondary | ICD-10-CM

## 2013-06-27 DIAGNOSIS — O09299 Supervision of pregnancy with other poor reproductive or obstetric history, unspecified trimester: Secondary | ICD-10-CM

## 2013-06-27 DIAGNOSIS — B9689 Other specified bacterial agents as the cause of diseases classified elsewhere: Secondary | ICD-10-CM | POA: Insufficient documentation

## 2013-06-27 DIAGNOSIS — Z3480 Encounter for supervision of other normal pregnancy, unspecified trimester: Secondary | ICD-10-CM | POA: Insufficient documentation

## 2013-06-27 DIAGNOSIS — Z3481 Encounter for supervision of other normal pregnancy, first trimester: Secondary | ICD-10-CM

## 2013-06-27 DIAGNOSIS — O26849 Uterine size-date discrepancy, unspecified trimester: Secondary | ICD-10-CM

## 2013-06-27 DIAGNOSIS — Z1389 Encounter for screening for other disorder: Secondary | ICD-10-CM

## 2013-06-27 DIAGNOSIS — Z3490 Encounter for supervision of normal pregnancy, unspecified, unspecified trimester: Secondary | ICD-10-CM

## 2013-06-27 DIAGNOSIS — O99320 Drug use complicating pregnancy, unspecified trimester: Secondary | ICD-10-CM

## 2013-06-27 DIAGNOSIS — O2390 Unspecified genitourinary tract infection in pregnancy, unspecified trimester: Secondary | ICD-10-CM

## 2013-06-27 LAB — CBC
HCT: 40.1 % (ref 36.0–46.0)
MCV: 87.2 fL (ref 78.0–100.0)
RBC: 4.6 MIL/uL (ref 3.87–5.11)
WBC: 10.3 10*3/uL (ref 4.0–10.5)

## 2013-06-27 LAB — POCT URINALYSIS DIPSTICK
Glucose, UA: NEGATIVE
Ketones, UA: NEGATIVE
Leukocytes, UA: NEGATIVE

## 2013-06-27 LAB — URINALYSIS
Bilirubin Urine: NEGATIVE
Hgb urine dipstick: NEGATIVE
Protein, ur: NEGATIVE mg/dL
Urobilinogen, UA: 0.2 mg/dL (ref 0.0–1.0)

## 2013-06-27 LAB — POCT WET PREP (WET MOUNT)

## 2013-06-27 MED ORDER — PROMETHAZINE HCL 25 MG PO TABS: 25.0000 mg | ORAL_TABLET | Freq: Four times a day (QID) | ORAL | Status: DC | PRN

## 2013-06-27 NOTE — Addendum Note (Signed)
Addended by: Cyril Mourning A on: 06/27/2013 01:57 PM   Modules accepted: Orders

## 2013-06-27 NOTE — Progress Notes (Signed)
U/S-single IUP with +FCA noted, CRL c/w 7+2wks, EDD 02/11/2014, cx long and closed, Rt ovary/adnexa with 5.2cm hyperechoic mass remains (?dermoid), lt ovary appears WNL, no free fluid

## 2013-06-27 NOTE — Progress Notes (Signed)
  Subjective:    Jamie [name] is a 27 y.o. [phone] African American female at [redacted]w[redacted]d by [place] being seen today for her first obstetrical visit.  Her obstetrical history is significant for abnormal pap and just quit smoking.  Pregnancy history fully reviewed.   Patient reports nausea.  Filed Vitals:   06/27/13 1132  BP: 112/60  Weight: 154 lb (69.854 kg)    HISTORY: OB History   Grav Para Term Preterm Abortions TAB SAB Ect Mult Living   8 2 1  5  2 1  2      # Outc Date GA Lbr Len/2nd Wgt Sex Del Anes PTL Lv   1 TRM 2004 [redacted]w[redacted]d   M CS      2 ECT 2009           3 PAR 2010    F LTCS   Yes   4 ABT            5 ABT            6 SAB            7 SAB            8 CUR              Past Medical History  Diagnosis Date  . Hx of chlamydia infection   . HSV-2 (herpes simplex virus 2) infection   . Abnormal Pap smear   . Hx of urinary tract infection   . Labial abscess   . Abnormal Pap smear   . BV (bacterial vaginosis) 06/27/2013   Past Surgical History  Procedure Laterality Date  . Cesarean section    . Ectopic pregnancy surgery     Family History  Problem Relation Age of Onset  . Diabetes Mother   . Hypertension Mother   . Hypertension Father      Exam    Pelvic Exam:    Perineum: Normal Perineum   Vulva: normal   Vagina:  normal mucosa, frothy discharge, no palpable nodules   Uterus   8 week size     Cervix: normal   Adnexa: Not palpable   Urinary: urethral meatus normal    System:     Skin: normal coloration and turgor, no rashes    Neurologic: oriented, normal mood   Extremities: normal strength, tone, and muscle mass   HEENT PERRLA   Mouth/Teeth mucous membranes moist   Cardiovascular: regular rate and rhythm   Respiratory:  appears well, vitals normal, no respiratory distress, acyanotic, normal RR   Abdomen: soft, non-tender    Request pap from clinic   Assessment:    Pregnancy: [id number] Patient Active Problem List   Diagnosis Date Noted  .  History of abnormal Pap smear 06/27/2013  . Pregnant 06/27/2013  . BV (bacterial vaginosis) 06/27/2013    wet prep showed clue cells, will treat at follow up visit. rx phenergan 25 mg 1 every 6 hours prn N/V [redacted]w[redacted]d [id number] New OB visit    Plan:     Initial labs drawn Continue prenatal vitamins Problem list reviewed and updated Reviewed n/v relief measures and warning s/s to report Reviewed recommended weight gain based on pre-gravid BMI Encouraged well-balanced diet Genetic Screening discussed Integrated Screen: requested Cystic fibrosis screening discussed requested Ultrasound discussed; fetal survey: requested Follow up in 4 weeks  Renezmae Canlas 06/27/2013 12:14 PM

## 2013-06-27 NOTE — Patient Instructions (Addendum)

## 2013-06-27 NOTE — Progress Notes (Signed)
Pt here today for New OB visit. Pt states she is having some pain on her lower left side, and having a thick white discharge with odor. She states she has noticed the discharge since she found out she was pregnant, it went away but came back. Pt denies any other issues or problems at this time. Pt was given CCNC form and lab consents to read over and sign.

## 2013-06-28 LAB — DRUG SCREEN, URINE, NO CONFIRMATION
Benzodiazepines.: NEGATIVE
Creatinine,U: 212.3 mg/dL
Methadone: NEGATIVE
Opiate Screen, Urine: NEGATIVE
Phencyclidine (PCP): NEGATIVE
Propoxyphene: NEGATIVE

## 2013-06-28 LAB — HEPATITIS B SURFACE ANTIGEN: Hepatitis B Surface Ag: NEGATIVE

## 2013-06-28 LAB — SICKLE CELL SCREEN: Sickle Cell Screen: NEGATIVE

## 2013-06-29 LAB — URINE CULTURE: Colony Count: NO GROWTH

## 2013-07-04 LAB — CYSTIC FIBROSIS DIAGNOSTIC STUDY

## 2013-07-25 ENCOUNTER — Encounter: Payer: Self-pay | Admitting: Advanced Practice Midwife

## 2013-07-25 ENCOUNTER — Other Ambulatory Visit: Payer: Self-pay | Admitting: Advanced Practice Midwife

## 2013-07-25 ENCOUNTER — Ambulatory Visit (INDEPENDENT_AMBULATORY_CARE_PROVIDER_SITE_OTHER): Payer: Medicaid Other

## 2013-07-25 ENCOUNTER — Ambulatory Visit (INDEPENDENT_AMBULATORY_CARE_PROVIDER_SITE_OTHER): Payer: Medicaid Other | Admitting: Advanced Practice Midwife

## 2013-07-25 VITALS — BP 110/70 | Wt 156.0 lb

## 2013-07-25 DIAGNOSIS — Z3490 Encounter for supervision of normal pregnancy, unspecified, unspecified trimester: Secondary | ICD-10-CM

## 2013-07-25 DIAGNOSIS — Z3481 Encounter for supervision of other normal pregnancy, first trimester: Secondary | ICD-10-CM

## 2013-07-25 DIAGNOSIS — D369 Benign neoplasm, unspecified site: Secondary | ICD-10-CM | POA: Insufficient documentation

## 2013-07-25 DIAGNOSIS — O219 Vomiting of pregnancy, unspecified: Secondary | ICD-10-CM

## 2013-07-25 DIAGNOSIS — Z36 Encounter for antenatal screening of mother: Secondary | ICD-10-CM

## 2013-07-25 DIAGNOSIS — O34219 Maternal care for unspecified type scar from previous cesarean delivery: Secondary | ICD-10-CM

## 2013-07-25 DIAGNOSIS — Z331 Pregnant state, incidental: Secondary | ICD-10-CM

## 2013-07-25 DIAGNOSIS — Z87898 Personal history of other specified conditions: Secondary | ICD-10-CM

## 2013-07-25 DIAGNOSIS — Z1389 Encounter for screening for other disorder: Secondary | ICD-10-CM

## 2013-07-25 DIAGNOSIS — O09299 Supervision of pregnancy with other poor reproductive or obstetric history, unspecified trimester: Secondary | ICD-10-CM

## 2013-07-25 LAB — POCT URINALYSIS DIPSTICK
Blood, UA: NEGATIVE
Ketones, UA: NEGATIVE
Protein, UA: NEGATIVE

## 2013-07-25 MED ORDER — ONDANSETRON HCL 8 MG PO TABS: 8.0000 mg | ORAL_TABLET | Freq: Three times a day (TID) | ORAL | Status: DC | PRN

## 2013-07-25 NOTE — Progress Notes (Signed)
U/S(11+2wks)-active fetus, CRL c/w dates, cx long and closed, lt ovary wnl, RT ovary with 5.0cm hyperechoic area remains (?dermoid), NB present, NT=1.54mm

## 2013-07-25 NOTE — Progress Notes (Signed)
Had NT/IT today.  BV untreated from last visit. 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  Says has had a pap since then at [facility].  Records requested.  Looks like hives on arm.  Continue hydrodortisone

## 2013-07-25 NOTE — Addendum Note (Signed)
Addended by: Shawna Clamp R on: 07/25/2013 12:29 PM   Modules accepted: Orders

## 2013-07-25 NOTE — Progress Notes (Signed)
HAVE a itching rash on right arm and elbow. ALL so white vaginal discharge . HAD on last visit, want to know if she can get something for discharge. HAD U/S.

## 2013-07-26 ENCOUNTER — Telehealth: Payer: Self-pay | Admitting: Advanced Practice Midwife

## 2013-07-26 MED ORDER — METRONIDAZOLE 500 MG PO TABS: 500.0000 mg | ORAL_TABLET | Freq: Two times a day (BID) | ORAL | Status: DC

## 2013-07-26 NOTE — Telephone Encounter (Signed)
Filled rx for flagyl

## 2013-07-26 NOTE — Telephone Encounter (Signed)
Pt aware that flagyl was sent to pharmacy

## 2013-08-22 ENCOUNTER — Ambulatory Visit (INDEPENDENT_AMBULATORY_CARE_PROVIDER_SITE_OTHER): Payer: Medicaid Other | Admitting: Obstetrics & Gynecology

## 2013-08-22 ENCOUNTER — Encounter: Payer: Self-pay | Admitting: Obstetrics & Gynecology

## 2013-08-22 ENCOUNTER — Other Ambulatory Visit: Payer: Self-pay | Admitting: Obstetrics & Gynecology

## 2013-08-22 VITALS — BP 130/80 | Wt 160.0 lb

## 2013-08-22 DIAGNOSIS — Z331 Pregnant state, incidental: Secondary | ICD-10-CM

## 2013-08-22 DIAGNOSIS — O09299 Supervision of pregnancy with other poor reproductive or obstetric history, unspecified trimester: Secondary | ICD-10-CM

## 2013-08-22 DIAGNOSIS — Z1389 Encounter for screening for other disorder: Secondary | ICD-10-CM

## 2013-08-22 LAB — POCT URINALYSIS DIPSTICK
Blood, UA: NEGATIVE
Ketones, UA: NEGATIVE

## 2013-08-22 NOTE — Patient Instructions (Signed)
Pregnancy - Second Trimester The second trimester of pregnancy (3 to 6 months) is a period of rapid growth for you and your baby. At the end of the sixth month, your baby is about 9 inches long and weighs 1 1/2 pounds. You will begin to feel the baby move between 18 and 20 weeks of the pregnancy. This is called quickening. Weight gain is faster. A clear fluid (colostrum) may leak out of your breasts. You may feel small contractions of the womb (uterus). This is known as false labor or Braxton-Hicks contractions. This is like a practice for labor when the baby is ready to be born. Usually, the problems with morning sickness have usually passed by the end of your first trimester. Some women develop small dark blotches (called cholasma, mask of pregnancy) on their face that usually goes away after the baby is born. Exposure to the sun makes the blotches worse. Acne may also develop in some pregnant women and pregnant women who have acne, may find that it goes away. PRENATAL EXAMS  Blood work may continue to be done during prenatal exams. These tests are done to check on your health and the probable health of your baby. Blood work is used to follow your blood levels (hemoglobin). Anemia (low hemoglobin) is common during pregnancy. Iron and vitamins are given to help prevent this. You will also be checked for diabetes between 24 and 28 weeks of the pregnancy. Some of the previous blood tests may be repeated.  The size of the uterus is measured during each visit. This is to make sure that the baby is continuing to grow properly according to the dates of the pregnancy.  Your blood pressure is checked every prenatal visit. This is to make sure you are not getting toxemia.  Your urine is checked to make sure you do not have an infection, diabetes or protein in the urine.  Your weight is checked often to make sure gains are happening at the suggested rate. This is to ensure that both you and your baby are  growing normally.  Sometimes, an ultrasound is performed to confirm the proper growth and development of the baby. This is a test which bounces harmless sound waves off the baby so your caregiver can more accurately determine due dates. Sometimes, a test is done on the amniotic fluid surrounding the baby. This test is called an amniocentesis. The amniotic fluid is obtained by sticking a needle into the belly (abdomen). This is done to check the chromosomes in instances where there is a concern about possible genetic problems with the baby. It is also sometimes done near the end of pregnancy if an early delivery is required. In this case, it is done to help make sure the baby's lungs are mature enough for the baby to live outside of the womb. CHANGES OCCURING IN THE SECOND TRIMESTER OF PREGNANCY Your body goes through many changes during pregnancy. They vary from person to person. Talk to your caregiver about changes you notice that you are concerned about.  During the second trimester, you will likely have an increase in your appetite. It is normal to have cravings for certain foods. This varies from person to person and pregnancy to pregnancy.  Your lower abdomen will begin to bulge.  You may have to urinate more often because the uterus and baby are pressing on your bladder. It is also common to get more bladder infections during pregnancy. You can help this by drinking lots of fluids   and emptying your bladder before and after intercourse.  You may begin to get stretch marks on your hips, abdomen, and breasts. These are normal changes in the body during pregnancy. There are no exercises or medicines to take that prevent this change.  You may begin to develop swollen and bulging veins (varicose veins) in your legs. Wearing support hose, elevating your feet for 15 minutes, 3 to 4 times a day and limiting salt in your diet helps lessen the problem.  Heartburn may develop as the uterus grows and  pushes up against the stomach. Antacids recommended by your caregiver helps with this problem. Also, eating smaller meals 4 to 5 times a day helps.  Constipation can be treated with a stool softener or adding bulk to your diet. Drinking lots of fluids, and eating vegetables, fruits, and whole grains are helpful.  Exercising is also helpful. If you have been very active up until your pregnancy, most of these activities can be continued during your pregnancy. If you have been less active, it is helpful to start an exercise program such as walking.  Hemorrhoids may develop at the end of the second trimester. Warm sitz baths and hemorrhoid cream recommended by your caregiver helps hemorrhoid problems.  Backaches may develop during this time of your pregnancy. Avoid heavy lifting, wear low heal shoes, and practice good posture to help with backache problems.  Some pregnant women develop tingling and numbness of their hand and fingers because of swelling and tightening of ligaments in the wrist (carpel tunnel syndrome). This goes away after the baby is born.  As your breasts enlarge, you may have to get a bigger bra. Get a comfortable, cotton, support bra. Do not get a nursing bra until the last month of the pregnancy if you will be nursing the baby.  You may get a dark line from your belly button to the pubic area called the linea nigra.  You may develop rosy cheeks because of increase blood flow to the face.  You may develop spider looking lines of the face, neck, arms, and chest. These go away after the baby is born. HOME CARE INSTRUCTIONS   It is extremely important to avoid all smoking, herbs, alcohol, and unprescribed drugs during your pregnancy. These chemicals affect the formation and growth of the baby. Avoid these chemicals throughout the pregnancy to ensure the delivery of a healthy infant.  Most of your home care instructions are the same as suggested for the first trimester of your  pregnancy. Keep your caregiver's appointments. Follow your caregiver's instructions regarding medicine use, exercise, and diet.  During pregnancy, you are providing food for you and your baby. Continue to eat regular, well-balanced meals. Choose foods such as meat, fish, milk and other low fat dairy products, vegetables, fruits, and whole-grain breads and cereals. Your caregiver will tell you of the ideal weight gain.  A physical sexual relationship may be continued up until near the end of pregnancy if there are no other problems. Problems could include early (premature) leaking of amniotic fluid from the membranes, vaginal bleeding, abdominal pain, or other medical or pregnancy problems.  Exercise regularly if there are no restrictions. Check with your caregiver if you are unsure of the safety of some of your exercises. The greatest weight gain will occur in the last 2 trimesters of pregnancy. Exercise will help you:  Control your weight.  Get you in shape for labor and delivery.  Lose weight after you have the baby.  Wear   a good support or jogging bra for breast tenderness during pregnancy. This may help if worn during sleep. Pads or tissues may be used in the bra if you are leaking colostrum.  Do not use hot tubs, steam rooms or saunas throughout the pregnancy.  Wear your seat belt at all times when driving. This protects you and your baby if you are in an accident.  Avoid raw meat, uncooked cheese, cat litter boxes, and soil used by cats. These carry germs that can cause birth defects in the baby.  The second trimester is also a good time to visit your dentist for your dental health if this has not been done yet. Getting your teeth cleaned is okay. Use a soft toothbrush. Brush gently during pregnancy.  It is easier to leak urine during pregnancy. Tightening up and strengthening the pelvic muscles will help with this problem. Practice stopping your urination while you are going to the  bathroom. These are the same muscles you need to strengthen. It is also the muscles you would use as if you were trying to stop from passing gas. You can practice tightening these muscles up 10 times a set and repeating this about 3 times per day. Once you know what muscles to tighten up, do not perform these exercises during urination. It is more likely to contribute to an infection by backing up the urine.  Ask for help if you have financial, counseling, or nutritional needs during pregnancy. Your caregiver will be able to offer counseling for these needs as well as refer you for other special needs.  Your skin may become oily. If so, wash your face with mild soap, use non-greasy moisturizer and oil or cream based makeup. MEDICINES AND DRUG USE IN PREGNANCY  Take prenatal vitamins as directed. The vitamin should contain 1 milligram of folic acid. Keep all vitamins out of reach of children. Only a couple vitamins or tablets containing iron may be fatal to a baby or young child when ingested.  Avoid use of all medicines, including herbs, over-the-counter medicines, not prescribed or suggested by your caregiver. Only take over-the-counter or prescription medicines for pain, discomfort, or fever as directed by your caregiver. Do not use aspirin.  Let your caregiver also know about herbs you may be using.  Alcohol is related to a number of birth defects. This includes fetal alcohol syndrome. All alcohol, in any form, should be avoided completely. Smoking will cause low birth rate and premature babies.  Street or illegal drugs are very harmful to the baby. They are absolutely forbidden. A baby born to an addicted mother will be addicted at birth. The baby will go through the same withdrawal an adult does. SEEK MEDICAL CARE IF:  You have any concerns or worries during your pregnancy. It is better to call with your questions if you feel they cannot wait, rather than worry about them. SEEK IMMEDIATE  MEDICAL CARE IF:   An unexplained oral temperature above 102 F (38.9 C) develops, or as your caregiver suggests.  You have leaking of fluid from the vagina (birth canal). If leaking membranes are suspected, take your temperature and tell your caregiver of this when you call.  There is vaginal spotting, bleeding, or passing clots. Tell your caregiver of the amount and how many pads are used. Light spotting in pregnancy is common, especially following intercourse.  You develop a bad smelling vaginal discharge with a change in the color from clear to white.  You continue to feel   sick to your stomach (nauseated) and have no relief from remedies suggested. You vomit blood or coffee ground-like materials.  You lose more than 2 pounds of weight or gain more than 2 pounds of weight over 1 week, or as suggested by your caregiver.  You notice swelling of your face, hands, feet, or legs.  You get exposed to German measles and have never had them.  You are exposed to fifth disease or chickenpox.  You develop belly (abdominal) pain. Round ligament discomfort is a common non-cancerous (benign) cause of abdominal pain in pregnancy. Your caregiver still must evaluate you.  You develop a bad headache that does not go away.  You develop fever, diarrhea, pain with urination, or shortness of breath.  You develop visual problems, blurry, or double vision.  You fall or are in a car accident or any kind of trauma.  There is mental or physical violence at home. Document Released: 12/06/2001 Document Revised: 09/05/2012 Document Reviewed: 06/10/2009 [employer] Patient Information 2014 [employer].  

## 2013-08-22 NOTE — Progress Notes (Signed)
No bleeding + flutters  No nausea 2nd IT today, sonogram 4 weeks

## 2013-08-22 NOTE — Progress Notes (Signed)
For 2 IT. 

## 2013-08-29 LAB — MATERNAL SCREEN, INTEGRATED #2
AFP, Serum: 28.3 ng/mL
Age risk Down Syndrome: 1:960 {titer}
Calculated Gestational Age: 15.4
Inhibin A Dimeric: 189 pg/mL
Inhibin A MoM: 1.06
MSS Down Syndrome: <1:5000 {titer}
MSS Trisomy 18 Risk: <1:5000 {titer}
NT MoM: 1.05
Number of fetuses: 1
PAPP-A: 662 ng/mL

## 2013-09-05 ENCOUNTER — Telehealth: Payer: Self-pay | Admitting: Adult Health

## 2013-09-05 DIAGNOSIS — D369 Benign neoplasm, unspecified site: Secondary | ICD-10-CM

## 2013-09-05 NOTE — Telephone Encounter (Signed)
Pt states she is very congested, Pt advised to try sudafed, tylenol, and robitussin. Pt verbalized understanding.

## 2013-09-08 ENCOUNTER — Inpatient Hospital Stay (HOSPITAL_COMMUNITY)
Admission: AD | Admit: 2013-09-08 | Discharge: 2013-09-08 | Disposition: A | Discharge status: Home / Self Care | Payer: Medicaid Other | Source: Ambulatory Visit | Attending: Obstetrics & Gynecology | Admitting: Obstetrics & Gynecology

## 2013-09-08 ENCOUNTER — Encounter (HOSPITAL_COMMUNITY): Payer: Self-pay

## 2013-09-08 DIAGNOSIS — N949 Unspecified condition associated with female genital organs and menstrual cycle: Secondary | ICD-10-CM | POA: Insufficient documentation

## 2013-09-08 DIAGNOSIS — R1084 Generalized abdominal pain: Secondary | ICD-10-CM

## 2013-09-08 DIAGNOSIS — O99891 Other specified diseases and conditions complicating pregnancy: Secondary | ICD-10-CM | POA: Insufficient documentation

## 2013-09-08 DIAGNOSIS — R109 Unspecified abdominal pain: Secondary | ICD-10-CM

## 2013-09-08 DIAGNOSIS — Z3490 Encounter for supervision of normal pregnancy, unspecified, unspecified trimester: Secondary | ICD-10-CM

## 2013-09-08 DIAGNOSIS — D369 Benign neoplasm, unspecified site: Secondary | ICD-10-CM

## 2013-09-08 LAB — URINALYSIS, ROUTINE W REFLEX MICROSCOPIC
Bilirubin Urine: NEGATIVE
Glucose, UA: NEGATIVE mg/dL
Ketones, ur: NEGATIVE mg/dL
Leukocytes, UA: NEGATIVE
Protein, ur: NEGATIVE mg/dL

## 2013-09-08 NOTE — MAU Note (Signed)
Pt states she started having tightening feeling yesterday. Did have sex this am and after climax had intense pain for around 15 minutes. Was seeing spots yesterday, concerned her bp may be high. Discussed with pt needs for plenty of water, pt has not been drinking enough water. Denies bleeding, does note regular vaginal discharge, no abnormal odor or color.

## 2013-09-08 NOTE — MAU Provider Note (Signed)
History     CSN: [id number]  Arrival date and time: 09/08/13 1115   First Provider Initiated Contact with Patient 09/08/13 1156      No chief complaint on file.  HPI Comments: Jamie [name] 27 y.o. [id number] presents to MAU today due to pelvic cramping. She was having intercourse this morning and after noticed cramping. Denies any bleeding or leaking of fluids.        Past Medical History  Diagnosis Date  . Hx of chlamydia infection   . HSV-2 (herpes simplex virus 2) infection   . Abnormal Pap smear   . Hx of urinary tract infection   . Labial abscess   . Abnormal Pap smear   . BV (bacterial vaginosis) 06/27/2013    Past Surgical History  Procedure Laterality Date  . Cesarean section    . Ectopic pregnancy surgery    . Dilation and curettage of uterus      Family History  Problem Relation Age of Onset  . Diabetes Mother   . Hypertension Mother   . Hypertension Father     History  Substance Use Topics  . Smoking status: Former Smoker -- 0.50 packs/day    Types: Cigarettes    Quit date: 06/06/2013  . Smokeless tobacco: Never Used  . Alcohol Use: No     Comment: occasionally    Allergies: No Known Allergies  Prescriptions prior to admission  Medication Sig Dispense Refill  . Prenatal Vit-Fe Fumarate-FA (PRENATAL MULTIVITAMIN) TABS tablet Take 1 tablet by mouth daily at 12 noon.      . pseudoephedrine (SUDAFED) 60 MG tablet Take 60 mg by mouth every 4 (four) hours as needed for congestion.      . Pseudoephedrine-DM-GG (ROBITUSSIN CF PO) Take 30 mLs by mouth daily as needed (cold).        Review of Systems  Constitutional: Negative.   HENT: Negative.   Eyes: Negative.   Respiratory: Negative.   Cardiovascular: Negative.   Gastrointestinal: Positive for abdominal pain.       Cramping only  Genitourinary: Negative.   Musculoskeletal: Negative.   Skin: Negative.   Neurological: Negative.   Psychiatric/Behavioral: Negative.    Physical Exam    Blood pressure 133/77, pulse 97, temperature 98.3 F (36.8 C), temperature source Oral, resp. rate 16, last menstrual period 04/30/2013.  Physical Exam  Constitutional: She is oriented to person, place, and time. She appears well-developed and well-nourished.  HENT:  Head: Normocephalic and atraumatic.  Eyes: Pupils are equal, round, and reactive to light.  GI: Soft. She exhibits no distension and no mass. There is no tenderness. There is no rebound and no guarding.  Genitourinary: Vagina normal and uterus normal. No vaginal discharge found.  External negative Internal: cervical os closed and long  Musculoskeletal: Normal range of motion.  Neurological: She is alert and oriented to person, place, and time.  Skin: Skin is warm and dry.  Psychiatric: She has a normal mood and affect. Her behavior is normal. Judgment and thought content normal.   Results for orders placed during the hospital encounter of 09/08/13 (from the past 24 hour(s))  URINALYSIS, ROUTINE W REFLEX MICROSCOPIC     Status: None   Collection Time    09/08/13 11:24 AM      Result Value Range   Color, Urine YELLOW  YELLOW   APPearance CLEAR  CLEAR   Specific Gravity, Urine 1.025  1.005 - 1.030   pH 6.5  5.0 - 8.0   Glucose,  UA NEGATIVE  NEGATIVE mg/dL   Hgb urine dipstick NEGATIVE  NEGATIVE   Bilirubin Urine NEGATIVE  NEGATIVE   Ketones, ur NEGATIVE  NEGATIVE mg/dL   Protein, ur NEGATIVE  NEGATIVE mg/dL   Urobilinogen, UA 0.2  0.0 - 1.0 mg/dL   Nitrite NEGATIVE  NEGATIVE   Leukocytes, UA NEGATIVE  NEGATIVE     MAU Course  Procedures  MDM UA  Assessment and Plan  A: Pregnancy Abdominal cramping  P: Increase fluids/ rest/ tylenol Follow up with [facility] as needed  Carolynn Serve 09/08/2013, 12:09 PM

## 2013-09-19 ENCOUNTER — Other Ambulatory Visit: Payer: Self-pay | Admitting: Obstetrics & Gynecology

## 2013-09-19 ENCOUNTER — Encounter: Payer: Self-pay | Admitting: Advanced Practice Midwife

## 2013-09-19 ENCOUNTER — Ambulatory Visit (INDEPENDENT_AMBULATORY_CARE_PROVIDER_SITE_OTHER): Payer: Medicaid Other | Admitting: Advanced Practice Midwife

## 2013-09-19 ENCOUNTER — Ambulatory Visit (INDEPENDENT_AMBULATORY_CARE_PROVIDER_SITE_OTHER): Payer: Medicaid Other

## 2013-09-19 VITALS — BP 130/64 | Wt 164.0 lb

## 2013-09-19 DIAGNOSIS — O34219 Maternal care for unspecified type scar from previous cesarean delivery: Secondary | ICD-10-CM

## 2013-09-19 DIAGNOSIS — Z1389 Encounter for screening for other disorder: Secondary | ICD-10-CM

## 2013-09-19 DIAGNOSIS — O09299 Supervision of pregnancy with other poor reproductive or obstetric history, unspecified trimester: Secondary | ICD-10-CM

## 2013-09-19 DIAGNOSIS — D369 Benign neoplasm, unspecified site: Secondary | ICD-10-CM

## 2013-09-19 DIAGNOSIS — Z331 Pregnant state, incidental: Secondary | ICD-10-CM

## 2013-09-19 DIAGNOSIS — F1920 Other psychoactive substance dependence, uncomplicated: Secondary | ICD-10-CM

## 2013-09-19 DIAGNOSIS — Z23 Encounter for immunization: Secondary | ICD-10-CM

## 2013-09-19 LAB — POCT URINALYSIS DIPSTICK
Blood, UA: NEGATIVE
Ketones, UA: NEGATIVE
Leukocytes, UA: NEGATIVE
Nitrite, UA: NEGATIVE
Protein, UA: NEGATIVE

## 2013-09-19 MED ORDER — INFLUENZA VAC SPLIT QUAD 0.5 ML IM SUSP
0.5000 mL | Freq: Once | INTRAMUSCULAR | Status: AC
  Administered 2013-09-19: 0.5 mL via INTRAMUSCULAR

## 2013-09-19 MED ORDER — CYCLOBENZAPRINE HCL 10 MG PO TABS: 10.0000 mg | ORAL_TABLET | Freq: Three times a day (TID) | ORAL | Status: DC | PRN

## 2013-09-19 NOTE — Progress Notes (Signed)
Had flu shot today.   Has been have HA 3/7 days. Originates in back of neck.  No trigger points.  Rx flexeril and tylenol.. Considering VBAC.,  Routine questions about pregnancy answered.  F/U in 4 weeks for LROB.

## 2013-09-19 NOTE — Progress Notes (Signed)
U/S(19+2wks)-active fetus, meas c/w dates, fluid wnl, no major abnl noted, post gr 0 plac, cx long and closed, Rt Dermoid=4.9cm, Lt Ovary appears wnl, no free fluid noted within pelvis, female fetus

## 2013-09-29 ENCOUNTER — Inpatient Hospital Stay (HOSPITAL_COMMUNITY)
Admission: AD | Admit: 2013-09-29 | Discharge: 2013-09-29 | Disposition: A | Discharge status: Home / Self Care | Payer: Medicaid Other | Source: Ambulatory Visit | Attending: Obstetrics & Gynecology | Admitting: Obstetrics & Gynecology

## 2013-09-29 ENCOUNTER — Encounter (HOSPITAL_COMMUNITY): Payer: Self-pay | Admitting: *Deleted

## 2013-09-29 DIAGNOSIS — S63509A Unspecified sprain of unspecified wrist, initial encounter: Secondary | ICD-10-CM | POA: Insufficient documentation

## 2013-09-29 DIAGNOSIS — Y93E5 Activity, floor mopping and cleaning: Secondary | ICD-10-CM | POA: Insufficient documentation

## 2013-09-29 DIAGNOSIS — S63501A Unspecified sprain of right wrist, initial encounter: Secondary | ICD-10-CM

## 2013-09-29 DIAGNOSIS — R209 Unspecified disturbances of skin sensation: Secondary | ICD-10-CM | POA: Insufficient documentation

## 2013-09-29 DIAGNOSIS — O99891 Other specified diseases and conditions complicating pregnancy: Secondary | ICD-10-CM | POA: Insufficient documentation

## 2013-09-29 DIAGNOSIS — R0602 Shortness of breath: Secondary | ICD-10-CM | POA: Insufficient documentation

## 2013-09-29 DIAGNOSIS — R030 Elevated blood-pressure reading, without diagnosis of hypertension: Secondary | ICD-10-CM | POA: Insufficient documentation

## 2013-09-29 DIAGNOSIS — Y92009 Unspecified place in unspecified non-institutional (private) residence as the place of occurrence of the external cause: Secondary | ICD-10-CM | POA: Insufficient documentation

## 2013-09-29 LAB — URINALYSIS, ROUTINE W REFLEX MICROSCOPIC
Bilirubin Urine: NEGATIVE
Hgb urine dipstick: NEGATIVE
Ketones, ur: NEGATIVE mg/dL
Nitrite: NEGATIVE
Specific Gravity, Urine: 1.02 (ref 1.005–1.030)
Urobilinogen, UA: 0.2 mg/dL (ref 0.0–1.0)
pH: 6.5 (ref 5.0–8.0)

## 2013-09-29 NOTE — Discharge Instructions (Signed)
Joint Sprain A sprain is a tear or stretch in the ligaments that hold a joint together. Severe sprains may need as long as 3-6 weeks of immobilization and/or exercises to heal completely. Sprained joints should be rested and protected. If not, they can become unstable and prone to re-injury. Proper treatment can reduce your pain, shorten the period of disability, and reduce the risk of repeated injuries. TREATMENT   Rest and elevate the injured joint to reduce pain and swelling.  Apply ice packs to the injury for 20-30 minutes every 2-3 hours for the next 2-3 days.  Keep the injury wrapped in a compression bandage or splint as long as the joint is painful or as instructed by your caregiver.  Do not use the injured joint until it is completely healed to prevent re-injury and chronic instability. Follow the instructions of your caregiver.  Long-term sprain management may require exercises and/or treatment by a physical therapist. Taping or special braces may help stabilize the joint until it is completely better. SEEK MEDICAL CARE IF:   You develop increased pain or swelling of the joint.  You develop increasing redness and warmth of the joint.  You develop a fever.  It becomes stiff.  Your hand gets cold or numb. Document Released: 01/19/2005 Document Revised: 03/05/2012 Document Reviewed: 12/29/2008 [facility] Patient Information 2014 [place], [place].

## 2013-09-29 NOTE — MAU Provider Note (Signed)
History     CSN: [id number]  Arrival date and time: 09/29/13 1620   None     Chief Complaint  Patient presents with  . numbness in hands    HPI Ms Long is a 27 y.o [id number] who presents for numbness in right hand and wrist for the past 3 days. Started on Friday with aggressive cleaning of her house including moping. Also felt sore in foot. No traumatic event, did not hear a pop or tear. Noticed swelling next morning upon awakening. Made worse by positional changes. Made better by shaking hand or holding in downward direction. Took 1/2 of flexeril with mild improvement. Symptoms seem to be getting progressively better over the past day.  Also c/o SOB. Improved currently but has bothered her in the past week. Seems to be better with positional changes i.e. Getting down on hands and knees. No URI symptoms no leg swelling. No chest pain or palps. Denies contractions. Felt a little nauseated lately. No vaginal bleeding or discharge. Feeling baby move.   No complications with pregnancy. [facility] [place]. First prenatal visit at 5 weeks. During initial pregnancy she was told she was "borderline diabetic", and BPs were mildly elevated, never on medications. Family hx of HTN and DMII.  OB History   Grav Para Term Preterm Abortions TAB SAB Ect Mult Living   7 2 2  4 2 1 1  2      [id number]  Past Medical History  Diagnosis Date  . Hx of chlamydia infection   . HSV-2 (herpes simplex virus 2) infection   . Abnormal Pap smear   . Hx of urinary tract infection   . Labial abscess   . Abnormal Pap smear   . BV (bacterial vaginosis) 06/27/2013    Past Surgical History  Procedure Laterality Date  . Cesarean section    . Ectopic pregnancy surgery    . Dilation and curettage of uterus      Family History  Problem Relation Age of Onset  . Diabetes Mother   . Hypertension Mother   . Hypertension Father     History  Substance Use Topics  . Smoking status: Former Smoker  -- 0.50 packs/day    Types: Cigarettes    Quit date: 06/06/2013  . Smokeless tobacco: Never Used  . Alcohol Use: No     Comment: occasionally    Allergies: No Known Allergies  Prescriptions prior to admission  Medication Sig Dispense Refill  . cyclobenzaprine (FLEXERIL) 10 MG tablet Take 1 tablet (10 mg total) by mouth every 8 (eight) hours as needed for muscle spasms.  30 tablet  1  . Prenatal Vit-Fe Fumarate-FA (PRENATAL MULTIVITAMIN) TABS tablet Take 1 tablet by mouth daily at 12 noon.        Review of Systems  Constitutional: Negative for fever and chills.  HENT: Negative for congestion and sore throat.   Eyes: Negative for blurred vision, double vision and discharge.  Respiratory: Positive for shortness of breath. Negative for cough and wheezing.   Cardiovascular: Negative for chest pain, palpitations and leg swelling.  Gastrointestinal: Negative for heartburn, nausea and vomiting.  Genitourinary: Negative for dysuria, frequency, hematuria and flank pain.  Musculoskeletal: Positive for myalgias and joint pain. Negative for falls.  Neurological: Positive for tingling and sensory change. Negative for dizziness, tremors, speech change, focal weakness, seizures, loss of consciousness and headaches.  Endo/Heme/Allergies: Does not bruise/bleed easily.  Psychiatric/Behavioral: Negative for depression.   Physical Exam   Blood  pressure 142/75, pulse 93, temperature 98.9 F (37.2 C), temperature source Oral, resp. rate 18, height 5\' 2"  (1.575 m), weight 76.204 kg (168 lb), last menstrual period 04/30/2013.  Physical Exam  Constitutional: She is oriented to person, place, and time. She appears well-developed and well-nourished. No distress.  HENT:  Head: Normocephalic and atraumatic.  Mouth/Throat: Oropharynx is clear and moist. No oropharyngeal exudate.  Eyes: EOM are normal. Pupils are equal, round, and reactive to light.  Neck: Normal range of motion. Neck supple.   Cardiovascular: Normal rate, regular rhythm and normal heart sounds.   No murmur heard. Respiratory: Effort normal and breath sounds normal. No respiratory distress. She has no wheezes. She has no rales.  GI: Soft. Bowel sounds are normal. There is no tenderness.  Musculoskeletal: Normal range of motion.  Neg finklesteins, no evidence of erythema or swelling on right right, normal ROM, no areas of point tenderness, neg tinel's   Neurological: She is alert and oriented to person, place, and time. She has normal reflexes. No cranial nerve deficit. She exhibits normal muscle tone. Coordination normal.  Skin: Skin is warm and dry. No erythema.  Psychiatric: She has a normal mood and affect.    MAU Course  Procedures  MDM Repeat BP 129/76  Assessment and Plan  27 y.o F [id number] at [redacted]w[redacted]d presenting for numbness and tingling in right wrist and SOB  1. Wrist sprain- no evidence of carpal tunnel syndrome or fracture or tendon tear. Likely 2/2 to injury while house keeping -ice cups -ace bandage -tylenol prn -rtc if wrist not improved or worsening, numbness or coldness in fingers  2. SOB- likely 2/2 normal state of pregnancy. No evidence of DVT, breath sounds not decreased, no URI symptoms, does not attest to GERD -continue to follow up with normal prenatal visit at [facility]  3. Isolated elevated BP here 142/75, repeat 129/76 -per pt has hx of mildly elevated BP with first pregnancy -will hold on PE labs for now -should keep close eye on this as outpt  Anselm Lis 09/29/2013, 7:02 PM   I have seen and examined this patient and agree with above documentation in the resident's note. Pt with hx of overworking cleaning and next day having a stiff wrist that is improving. Some numbness though to be due to swelling as resolved with elevation. Discussed conservative management as above. Also has a hx of intermittent SOB that lasts for a couple seconds adn feels like the baby is under her ribs. When  he moves, her SOB improves.  F/u as scheduled with [facility].    Rulon Abide, M.D. [facility] Fellow 09/29/2013 8:24 PM

## 2013-09-29 NOTE — Progress Notes (Signed)
Informed of patient arrival; complaint; G/P; gestation and complaint.

## 2013-09-29 NOTE — MAU Note (Signed)
Patient presents with complaint of numbness in right hand and wrist x 3 days.

## 2013-09-29 NOTE — MAU Note (Signed)
Pt presents with complaints of both of her hands feeling tight and numb since Friday, she says that the numbness was worse Friday but she is just concerned

## 2013-09-30 NOTE — MAU Provider Note (Signed)
Attestation of Attending Supervision of Advanced Practitioner (CNM/NP): Evaluation and management procedures were performed by the Advanced Practitioner under my supervision and collaboration. I have reviewed the Advanced Practitioner's note and chart, and I agree with the management and plan.  Gabi Mcfate H. 7:10 AM

## 2013-10-17 ENCOUNTER — Ambulatory Visit (INDEPENDENT_AMBULATORY_CARE_PROVIDER_SITE_OTHER): Payer: Medicaid Other | Admitting: Obstetrics and Gynecology

## 2013-10-17 VITALS — BP 136/70 | Wt 171.2 lb

## 2013-10-17 DIAGNOSIS — O09299 Supervision of pregnancy with other poor reproductive or obstetric history, unspecified trimester: Secondary | ICD-10-CM

## 2013-10-17 DIAGNOSIS — F1920 Other psychoactive substance dependence, uncomplicated: Secondary | ICD-10-CM

## 2013-10-17 DIAGNOSIS — Z1389 Encounter for screening for other disorder: Secondary | ICD-10-CM

## 2013-10-17 DIAGNOSIS — O0910 Supervision of pregnancy with history of ectopic or molar pregnancy, unspecified trimester: Secondary | ICD-10-CM

## 2013-10-17 DIAGNOSIS — O34219 Maternal care for unspecified type scar from previous cesarean delivery: Secondary | ICD-10-CM

## 2013-10-17 DIAGNOSIS — Z331 Pregnant state, incidental: Secondary | ICD-10-CM

## 2013-10-17 LAB — POCT URINALYSIS DIPSTICK
Blood, UA: NEGATIVE
Glucose, UA: NEGATIVE
Ketones, UA: NEGATIVE
Leukocytes, UA: NEGATIVE
Nitrite, UA: NEGATIVE

## 2013-10-17 NOTE — Progress Notes (Signed)
Patient experienced an  "scratchy" sensation that feels like it's up inside the vaginal area when the baby moves. Patient is concerned that "baby might come early" physical exam shows a closed long cervix Impression: no evidence of preterm labor or cervical changes. Also, lengthy discussion of her vaginal birth after cesarean versus repeat cesarean. After discussion patient is leaning toward repeat cesarean section.

## 2013-10-17 NOTE — Progress Notes (Signed)
WI due to abdominal cramping and whitish mucus discharge.

## 2013-10-21 ENCOUNTER — Encounter: Payer: Medicaid Other | Admitting: Women's Health

## 2013-11-13 ENCOUNTER — Telehealth: Payer: Self-pay | Admitting: Obstetrics and Gynecology

## 2013-11-13 NOTE — Telephone Encounter (Signed)
C/o a lot of pressure this am, laid down and is now feeling better,  + FM, no vaginally bleeding. Can call office back if as needed today. Pt informed to keep her appt tomorrow if no changes. Pt verbalized understanding.

## 2013-11-14 ENCOUNTER — Other Ambulatory Visit: Payer: Medicaid Other

## 2013-11-14 ENCOUNTER — Ambulatory Visit (INDEPENDENT_AMBULATORY_CARE_PROVIDER_SITE_OTHER): Payer: Medicaid Other | Admitting: Advanced Practice Midwife

## 2013-11-14 ENCOUNTER — Encounter: Payer: Self-pay | Admitting: Advanced Practice Midwife

## 2013-11-14 VITALS — BP 122/82 | Wt 175.5 lb

## 2013-11-14 DIAGNOSIS — F1920 Other psychoactive substance dependence, uncomplicated: Secondary | ICD-10-CM

## 2013-11-14 DIAGNOSIS — O0910 Supervision of pregnancy with history of ectopic or molar pregnancy, unspecified trimester: Secondary | ICD-10-CM

## 2013-11-14 DIAGNOSIS — O09299 Supervision of pregnancy with other poor reproductive or obstetric history, unspecified trimester: Secondary | ICD-10-CM

## 2013-11-14 DIAGNOSIS — Z3490 Encounter for supervision of normal pregnancy, unspecified, unspecified trimester: Secondary | ICD-10-CM

## 2013-11-14 DIAGNOSIS — O34219 Maternal care for unspecified type scar from previous cesarean delivery: Secondary | ICD-10-CM

## 2013-11-14 DIAGNOSIS — Z1389 Encounter for screening for other disorder: Secondary | ICD-10-CM

## 2013-11-14 DIAGNOSIS — Z331 Pregnant state, incidental: Secondary | ICD-10-CM

## 2013-11-14 DIAGNOSIS — Z3483 Encounter for supervision of other normal pregnancy, third trimester: Secondary | ICD-10-CM

## 2013-11-14 DIAGNOSIS — B009 Herpesviral infection, unspecified: Secondary | ICD-10-CM

## 2013-11-14 LAB — CBC
HCT: 36.1 % (ref 36.0–46.0)
Hemoglobin: 12.9 g/dL (ref 12.0–15.0)
MCH: 31.8 pg (ref 26.0–34.0)
MCHC: 35.7 g/dL (ref 30.0–36.0)
MCV: 88.9 fL (ref 78.0–100.0)
RBC: 4.06 MIL/uL (ref 3.87–5.11)
RDW: 12.5 % (ref 11.5–15.5)
WBC: 12 10*3/uL — ABNORMAL HIGH (ref 4.0–10.5)

## 2013-11-14 LAB — POCT URINALYSIS DIPSTICK
Blood, UA: NEGATIVE
Glucose, UA: NEGATIVE
Ketones, UA: NEGATIVE
Leukocytes, UA: NEGATIVE
Nitrite, UA: NEGATIVE

## 2013-11-14 NOTE — Progress Notes (Signed)
Having some tingling in hands, worst at night. Recommended a hand splint to help. C/o lower abdominal pressure. Encourage a maternity support belt to decrease pressure. Denies any vaginal bleeding and leaking of fluid. Still thinking about VBAC vs. Repeat c-section. All questions answered. F/u in 4 weeks for LROB

## 2013-11-15 LAB — HIV ANTIBODY (ROUTINE TESTING W REFLEX): HIV: NONREACTIVE

## 2013-11-15 LAB — RPR

## 2013-11-15 LAB — HSV 2 ANTIBODY, IGG: HSV 2 Glycoprotein G Ab, IgG: 5.99 IV — ABNORMAL HIGH

## 2013-11-15 LAB — GLUCOSE TOLERANCE, 2 HOURS W/ 1HR
Glucose, 1 hour: 123 mg/dL (ref 70–170)
Glucose, Fasting: 80 mg/dL (ref 70–99)

## 2013-11-20 DIAGNOSIS — B009 Herpesviral infection, unspecified: Secondary | ICD-10-CM | POA: Insufficient documentation

## 2013-12-10 ENCOUNTER — Encounter: Payer: Self-pay | Admitting: Women's Health

## 2013-12-10 ENCOUNTER — Ambulatory Visit (INDEPENDENT_AMBULATORY_CARE_PROVIDER_SITE_OTHER): Payer: Medicaid Other | Admitting: Women's Health

## 2013-12-10 VITALS — BP 140/80 | Wt 179.0 lb

## 2013-12-10 DIAGNOSIS — B3731 Acute candidiasis of vulva and vagina: Secondary | ICD-10-CM

## 2013-12-10 DIAGNOSIS — O09299 Supervision of pregnancy with other poor reproductive or obstetric history, unspecified trimester: Secondary | ICD-10-CM

## 2013-12-10 DIAGNOSIS — F1920 Other psychoactive substance dependence, uncomplicated: Secondary | ICD-10-CM

## 2013-12-10 DIAGNOSIS — Z3490 Encounter for supervision of normal pregnancy, unspecified, unspecified trimester: Secondary | ICD-10-CM

## 2013-12-10 DIAGNOSIS — B373 Candidiasis of vulva and vagina: Secondary | ICD-10-CM

## 2013-12-10 DIAGNOSIS — Z1389 Encounter for screening for other disorder: Secondary | ICD-10-CM

## 2013-12-10 DIAGNOSIS — Z3483 Encounter for supervision of other normal pregnancy, third trimester: Secondary | ICD-10-CM

## 2013-12-10 DIAGNOSIS — IMO0001 Reserved for inherently not codable concepts without codable children: Secondary | ICD-10-CM

## 2013-12-10 DIAGNOSIS — Z98891 History of uterine scar from previous surgery: Secondary | ICD-10-CM | POA: Insufficient documentation

## 2013-12-10 DIAGNOSIS — O34219 Maternal care for unspecified type scar from previous cesarean delivery: Secondary | ICD-10-CM

## 2013-12-10 DIAGNOSIS — O0910 Supervision of pregnancy with history of ectopic or molar pregnancy, unspecified trimester: Secondary | ICD-10-CM

## 2013-12-10 DIAGNOSIS — O2390 Unspecified genitourinary tract infection in pregnancy, unspecified trimester: Secondary | ICD-10-CM

## 2013-12-10 DIAGNOSIS — N898 Other specified noninflammatory disorders of vagina: Secondary | ICD-10-CM

## 2013-12-10 DIAGNOSIS — Z331 Pregnant state, incidental: Secondary | ICD-10-CM

## 2013-12-10 DIAGNOSIS — F1290 Cannabis use, unspecified, uncomplicated: Secondary | ICD-10-CM | POA: Insufficient documentation

## 2013-12-10 LAB — COMPREHENSIVE METABOLIC PANEL
ALT: 12 U/L (ref 0–35)
AST: 16 U/L (ref 0–37)
Calcium: 9.1 mg/dL (ref 8.4–10.5)
Chloride: 103 mEq/L (ref 96–112)
Creat: 0.53 mg/dL (ref 0.50–1.10)
Glucose, Bld: 75 mg/dL (ref 70–99)
Total Bilirubin: 0.3 mg/dL (ref 0.3–1.2)

## 2013-12-10 LAB — CBC
HCT: 36.1 % (ref 36.0–46.0)
MCH: 30.3 pg (ref 26.0–34.0)
MCHC: 34.9 g/dL (ref 30.0–36.0)
MCV: 86.8 fL (ref 78.0–100.0)
Platelets: 211 10*3/uL (ref 150–400)
RDW: 12.5 % (ref 11.5–15.5)

## 2013-12-10 LAB — POCT URINALYSIS DIPSTICK
Blood, UA: NEGATIVE
Protein, UA: NEGATIVE

## 2013-12-10 LAB — POCT WET PREP (WET MOUNT)

## 2013-12-10 MED ORDER — FLUCONAZOLE 150 MG PO TABS: 150.0000 mg | ORAL_TABLET | Freq: Once | ORAL | Status: DC

## 2013-12-10 NOTE — Progress Notes (Signed)
Feels like she has a yeast infection.

## 2013-12-10 NOTE — Progress Notes (Signed)
4+glucose urine, ate waffles w/ syrup and soda this am. Passed 2hr gtt. Reports good fm. Denies uc's, lof, vb, urinary frequency, urgency, hesitancy, or dysuria.  HA and seeing spots yesterday, bp elevated today. Denies ruq/epigastric pain, n/v. States she had some issues w/ bp in first pregnancy, never dx w/ pre-e. No problems during second pregnancy. This is a different fob. Will get labs today. DTRs 2+, trace BLE edema, no clonus. Vulvovaginal itching x 1 wk. Spec exam: mod amount thick white nonodorous d/c, wet prep + yeast. Rx diflucan.  Reviewed ptl s/s, pre-e s/s, fkc.  All questions answered. F/U in 3d for bp check, f/u visit.

## 2013-12-10 NOTE — Patient Instructions (Addendum)
Call office or go to [facility] hospital for headache not relieved by tylenol, visual changes- seeing spots, double, blurred vision, pain under your right breast or heartburn that doesn't go away with tums, etc., nausea and/or vomiting, or other concerns.    Preeclampsia and Eclampsia Preeclampsia is a condition of high blood pressure during pregnancy. It can happen at 20 weeks or later in pregnancy. If high blood pressure occurs in the second half of pregnancy with no other symptoms, it is called gestational hypertension and goes away after the baby is born. If any of the symptoms listed below develop with gestational hypertension, it is then called preeclampsia. Eclampsia (convulsions) may follow preeclampsia. This is one of the reasons for regular prenatal checkups. Early diagnosis and treatment are very important to prevent eclampsia. CAUSES  There is no known cause of preeclampsia/eclampsia in pregnancy. There are several known conditions that may put the pregnant woman at risk, such as:  The first pregnancy.  Having preeclampsia in a past pregnancy.  Having lasting (chronic) high blood pressure.  Having multiples (twins, triplets).  Being age 26 or older.  African American ethnic background.  Having kidney disease or diabetes.  Medical conditions such as lupus or blood diseases.  Being overweight (obese). SYMPTOMS   High blood pressure.  Headaches.  Sudden weight gain.  Swelling of hands, face, legs, and feet.  Protein in the urine.  Feeling sick to your stomach (nauseous) and throwing up (vomiting).  Vision problems (blurred or double vision).  Numbness in the face, arms, legs, and feet.  Dizziness.  Slurred speech.  Preeclampsia can cause growth retardation in the fetus.  Separation (abruption) of the placenta.  Not enough fluid in the amniotic sac (oligohydramnios).  Sensitivity to bright lights.  Belly (abdominal) pain. DIAGNOSIS  If protein is found in  the urine in the second half of pregnancy, this is considered preeclampsia. Other symptoms mentioned above may also be present. TREATMENT  It is necessary to treat this.  Your caregiver may prescribe bed rest early in this condition. Plenty of rest and salt restriction may be all that is needed.  Medicines may be necessary to lower blood pressure if the condition does not respond to more conservative measures.  In more severe cases, hospitalization may be needed:  For treatment of blood pressure.  To control fluid retention.  To monitor the baby to see if the condition is causing harm to the baby.  Hospitalization is the best way to treat the first sign of preeclampsia. This is so the mother and baby can be watched closely and blood tests can be done effectively and correctly.  If the condition becomes severe, it may be necessary to induce labor or to remove the infant by surgical means (cesarean section). The best cure for preeclampsia/eclampsia is to deliver the baby. Preeclampsia and eclampsia involve risks to mother and infant. Your caregiver will discuss these risks with you. Together, you can work out the best possible approach to your problems. Make sure you keep your prenatal visits as scheduled. Not keeping appointments could result in a chronic or permanent injury, pain, disability to you, and death or injury to you or your unborn baby. If there is any problem keeping the appointment, you must call to reschedule. HOME CARE INSTRUCTIONS   Keep your prenatal appointments and tests as scheduled.  Tell your caregiver if you have any of the above risk factors.  Get plenty of rest and sleep.  Eat a balanced diet that is  low in salt, and do not add salt to your food.  Avoid stressful situations.  Only take over-the-counter and prescriptions medicines for pain, discomfort, or fever as directed by your caregiver. SEEK IMMEDIATE MEDICAL CARE IF:   You develop severe swelling  anywhere in the body. This usually occurs in the legs.  You gain 05 lb/2.3 kg or more in a week.  You develop a severe headache, dizziness, problems with your vision, or confusion.  You have abdominal pain, nausea, or vomiting.  You have a seizure.  You have trouble moving any part of your body, or you develop numbness or problems speaking.  You have bruising or abnormal bleeding from anywhere in the body.  You develop a stiff neck.  You pass out. MAKE SURE YOU:   Understand these instructions.  Will watch your condition.  Will get help right away if you are not doing well or get worse. Document Released: 12/09/2000 Document Revised: 03/05/2012 Document Reviewed: 07/25/2008 [facility] Patient Information 2014 [place], [place].  Third Trimester of Pregnancy The third trimester is from week 29 through week 42, months 7 through 9. The third trimester is a time when the fetus is growing rapidly. At the end of the ninth month, the fetus is about 20 inches in length and weighs 6 10 pounds.  BODY CHANGES Your body goes through many changes during pregnancy. The changes vary from woman to woman.   Your weight will continue to increase. You can expect to gain 25 35 pounds (11 16 kg) by the end of the pregnancy.  You may begin to get stretch marks on your hips, abdomen, and breasts.  You may urinate more often because the fetus is moving lower into your pelvis and pressing on your bladder.  You may develop or continue to have heartburn as a result of your pregnancy.  You may develop constipation because certain hormones are causing the muscles that push waste through your intestines to slow down.  You may develop hemorrhoids or swollen, bulging veins (varicose veins).  You may have pelvic pain because of the weight gain and pregnancy hormones relaxing your joints between the bones in your pelvis. Back aches may result from over exertion of the muscles supporting your posture.  Your  breasts will continue to grow and be tender. A yellow discharge may leak from your breasts called colostrum.  Your belly button may stick out.  You may feel short of breath because of your expanding uterus.  You may notice the fetus "dropping," or moving lower in your abdomen.  You may have a bloody mucus discharge. This usually occurs a few days to a week before labor begins.  Your cervix becomes thin and soft (effaced) near your due date. WHAT TO EXPECT AT YOUR PRENATAL EXAMS  You will have prenatal exams every 2 weeks until week 36. Then, you will have weekly prenatal exams. During a routine prenatal visit:  You will be weighed to make sure you and the fetus are growing normally.  Your blood pressure is taken.  Your abdomen will be measured to track your baby's growth.  The fetal heartbeat will be listened to.  Any test results from the previous visit will be discussed.  You may have a cervical check near your due date to see if you have effaced. At around 36 weeks, your caregiver will check your cervix. At the same time, your caregiver will also perform a test on the secretions of the vaginal tissue. This test is to  determine if a type of bacteria, Group B streptococcus, is present. Your caregiver will explain this further. Your caregiver may ask you:  What your birth plan is.  How you are feeling.  If you are feeling the baby move.  If you have had any abnormal symptoms, such as leaking fluid, bleeding, severe headaches, or abdominal cramping.  If you have any questions. Other tests or screenings that may be performed during your third trimester include:  Blood tests that check for low iron levels (anemia).  Fetal testing to check the health, activity level, and growth of the fetus. Testing is done if you have certain medical conditions or if there are problems during the pregnancy. FALSE LABOR You may feel small, irregular contractions that eventually go away. These  are called Braxton Hicks contractions, or false labor. Contractions may last for hours, days, or even weeks before true labor sets in. If contractions come at regular intervals, intensify, or become painful, it is best to be seen by your caregiver.  SIGNS OF LABOR   Menstrual-like cramps.  Contractions that are 5 minutes apart or less.  Contractions that start on the top of the uterus and spread down to the lower abdomen and back.  A sense of increased pelvic pressure or back pain.  A watery or bloody mucus discharge that comes from the vagina. If you have any of these signs before the 37th week of pregnancy, call your caregiver right away. You need to go to the hospital to get checked immediately. HOME CARE INSTRUCTIONS   Avoid all smoking, herbs, alcohol, and unprescribed drugs. These chemicals affect the formation and growth of the baby.  Follow your caregiver's instructions regarding medicine use. There are medicines that are either safe or unsafe to take during pregnancy.  Exercise only as directed by your caregiver. Experiencing uterine cramps is a good sign to stop exercising.  Continue to eat regular, healthy meals.  Wear a good support bra for breast tenderness.  Do not use hot tubs, steam rooms, or saunas.  Wear your seat belt at all times when driving.  Avoid raw meat, uncooked cheese, cat litter boxes, and soil used by cats. These carry germs that can cause birth defects in the baby.  Take your prenatal vitamins.  Try taking a stool softener (if your caregiver approves) if you develop constipation. Eat more high-fiber foods, such as fresh vegetables or fruit and whole grains. Drink plenty of fluids to keep your urine clear or pale yellow.  Take warm sitz baths to soothe any pain or discomfort caused by hemorrhoids. Use hemorrhoid cream if your caregiver approves.  If you develop varicose veins, wear support hose. Elevate your feet for 15 minutes, 3 4 times a day.  Limit salt in your diet.  Avoid heavy lifting, wear low heal shoes, and practice good posture.  Rest a lot with your legs elevated if you have leg cramps or low back pain.  Visit your dentist if you have not gone during your pregnancy. Use a soft toothbrush to brush your teeth and be gentle when you floss.  A sexual relationship may be continued unless your caregiver directs you otherwise.  Do not travel far distances unless it is absolutely necessary and only with the approval of your caregiver.  Take prenatal classes to understand, practice, and ask questions about the labor and delivery.  Make a trial run to the hospital.  Pack your hospital bag.  Prepare the baby's nursery.  Continue to go to  all your prenatal visits as directed by your caregiver. SEEK MEDICAL CARE IF:  You are unsure if you are in labor or if your water has broken.  You have dizziness.  You have mild pelvic cramps, pelvic pressure, or nagging pain in your abdominal area.  You have persistent nausea, vomiting, or diarrhea.  You have a bad smelling vaginal discharge.  You have pain with urination. SEEK IMMEDIATE MEDICAL CARE IF:   You have a fever.  You are leaking fluid from your vagina.  You have spotting or bleeding from your vagina.  You have severe abdominal cramping or pain.  You have rapid weight loss or gain.  You have shortness of breath with chest pain.  You notice sudden or extreme swelling of your face, hands, ankles, feet, or legs.  You have not felt your baby move in over an hour.  You have severe headaches that do not go away with medicine.  You have vision changes. Document Released: 12/06/2001 Document Revised: 08/14/2013 Document Reviewed: 02/12/2013 [facility] Patient Information 2014 [place], [place].

## 2013-12-11 LAB — PROTEIN / CREATININE RATIO, URINE
Creatinine, Urine: 129.7 mg/dL
Protein Creatinine Ratio: 0.14 (ref <0.15)
Total Protein, Urine: 18 mg/dL

## 2013-12-13 ENCOUNTER — Ambulatory Visit (INDEPENDENT_AMBULATORY_CARE_PROVIDER_SITE_OTHER): Payer: Medicaid Other | Admitting: Obstetrics & Gynecology

## 2013-12-13 ENCOUNTER — Encounter: Payer: Self-pay | Admitting: Obstetrics & Gynecology

## 2013-12-13 VITALS — BP 128/60 | Wt 183.0 lb

## 2013-12-13 DIAGNOSIS — O09299 Supervision of pregnancy with other poor reproductive or obstetric history, unspecified trimester: Secondary | ICD-10-CM

## 2013-12-13 DIAGNOSIS — Z331 Pregnant state, incidental: Secondary | ICD-10-CM

## 2013-12-13 DIAGNOSIS — O0910 Supervision of pregnancy with history of ectopic or molar pregnancy, unspecified trimester: Secondary | ICD-10-CM

## 2013-12-13 DIAGNOSIS — F1920 Other psychoactive substance dependence, uncomplicated: Secondary | ICD-10-CM

## 2013-12-13 DIAGNOSIS — O34219 Maternal care for unspecified type scar from previous cesarean delivery: Secondary | ICD-10-CM

## 2013-12-13 DIAGNOSIS — Z1389 Encounter for screening for other disorder: Secondary | ICD-10-CM

## 2013-12-13 DIAGNOSIS — Z3483 Encounter for supervision of other normal pregnancy, third trimester: Secondary | ICD-10-CM

## 2013-12-13 LAB — POCT URINALYSIS DIPSTICK
Blood, UA: NEGATIVE
Nitrite, UA: NEGATIVE
Protein, UA: NEGATIVE

## 2013-12-13 MED ORDER — PRENATAL MULTIVITAMIN CH: 1.0000 | ORAL_TABLET | Freq: Every day | ORAL | Status: DC

## 2013-12-13 NOTE — Addendum Note (Signed)
Addended by: Criss Alvine on: 12/13/2013 02:13 PM   Modules accepted: Orders

## 2013-12-13 NOTE — Progress Notes (Signed)
BP weight and urine results all reviewed and noted. Patient reports good fetal movement, denies any bleeding and no rupture of membranes symptoms or regular contractions. Patient is without complaints. All questions were answered.  

## 2013-12-30 ENCOUNTER — Ambulatory Visit (INDEPENDENT_AMBULATORY_CARE_PROVIDER_SITE_OTHER): Payer: Medicaid Other | Admitting: Women's Health

## 2013-12-30 ENCOUNTER — Encounter: Payer: Self-pay | Admitting: Women's Health

## 2013-12-30 VITALS — BP 140/72 | Wt 188.0 lb

## 2013-12-30 DIAGNOSIS — O0910 Supervision of pregnancy with history of ectopic or molar pregnancy, unspecified trimester: Secondary | ICD-10-CM

## 2013-12-30 DIAGNOSIS — O09299 Supervision of pregnancy with other poor reproductive or obstetric history, unspecified trimester: Secondary | ICD-10-CM

## 2013-12-30 DIAGNOSIS — O34219 Maternal care for unspecified type scar from previous cesarean delivery: Secondary | ICD-10-CM

## 2013-12-30 DIAGNOSIS — B009 Herpesviral infection, unspecified: Secondary | ICD-10-CM

## 2013-12-30 DIAGNOSIS — O99019 Anemia complicating pregnancy, unspecified trimester: Secondary | ICD-10-CM

## 2013-12-30 DIAGNOSIS — F1920 Other psychoactive substance dependence, uncomplicated: Secondary | ICD-10-CM

## 2013-12-30 DIAGNOSIS — Z3483 Encounter for supervision of other normal pregnancy, third trimester: Secondary | ICD-10-CM

## 2013-12-30 DIAGNOSIS — Z1389 Encounter for screening for other disorder: Secondary | ICD-10-CM

## 2013-12-30 DIAGNOSIS — Z331 Pregnant state, incidental: Secondary | ICD-10-CM

## 2013-12-30 DIAGNOSIS — O1213 Gestational proteinuria, third trimester: Secondary | ICD-10-CM

## 2013-12-30 DIAGNOSIS — Z3490 Encounter for supervision of normal pregnancy, unspecified, unspecified trimester: Secondary | ICD-10-CM

## 2013-12-30 DIAGNOSIS — O99320 Drug use complicating pregnancy, unspecified trimester: Secondary | ICD-10-CM

## 2013-12-30 DIAGNOSIS — R768 Other specified abnormal immunological findings in serum: Secondary | ICD-10-CM

## 2013-12-30 DIAGNOSIS — O98519 Other viral diseases complicating pregnancy, unspecified trimester: Secondary | ICD-10-CM

## 2013-12-30 LAB — CBC
HCT: 33.9 % — ABNORMAL LOW (ref 36.0–46.0)
Hemoglobin: 11.8 g/dL — ABNORMAL LOW (ref 12.0–15.0)
MCH: 29.8 pg (ref 26.0–34.0)
MCHC: 34.8 g/dL (ref 30.0–36.0)
MCV: 85.6 fL (ref 78.0–100.0)
Platelets: 212 10*3/uL (ref 150–400)
RBC: 3.96 MIL/uL (ref 3.87–5.11)
RDW: 13.1 % (ref 11.5–15.5)
WBC: 8.8 10*3/uL (ref 4.0–10.5)

## 2013-12-30 LAB — POCT URINALYSIS DIPSTICK
Blood, UA: NEGATIVE
KETONES UA: NEGATIVE
LEUKOCYTES UA: NEGATIVE
NITRITE UA: NEGATIVE

## 2013-12-30 LAB — COMPREHENSIVE METABOLIC PANEL
ALBUMIN: 3.2 g/dL — AB (ref 3.5–5.2)
ALK PHOS: 122 U/L — AB (ref 39–117)
ALT: 16 U/L (ref 0–35)
AST: 20 U/L (ref 0–37)
BUN: 5 mg/dL — ABNORMAL LOW (ref 6–23)
CALCIUM: 9.2 mg/dL (ref 8.4–10.5)
CHLORIDE: 103 meq/L (ref 96–112)
CO2: 22 mEq/L (ref 19–32)
Creat: 0.49 mg/dL — ABNORMAL LOW (ref 0.50–1.10)
GLUCOSE: 91 mg/dL (ref 70–99)
POTASSIUM: 3.7 meq/L (ref 3.5–5.3)
SODIUM: 138 meq/L (ref 135–145)
TOTAL PROTEIN: 5.8 g/dL — AB (ref 6.0–8.3)
Total Bilirubin: 0.4 mg/dL (ref 0.3–1.2)

## 2013-12-30 MED ORDER — ACYCLOVIR 400 MG PO TABS: 400.0000 mg | ORAL_TABLET | Freq: Three times a day (TID) | ORAL | Status: DC

## 2013-12-30 NOTE — Patient Instructions (Signed)
Call the office or go to [facility] if:  You begin to have strong, frequent contractions  Your water breaks.  Sometimes it is a big gush of fluid, sometimes it is just a trickle that keeps getting your panties wet or running down your legs  You have vaginal bleeding.  It is normal to have a small amount of spotting if your cervix was checked.   You don't feel your baby moving like normal.  If you don't, get you something to eat and drink and lay down and focus on feeling your baby move.  You should feel at least 10 movements in 2 hours.  If you don't, you should call the office or go to [facility].   Preeclampsia and Eclampsia Preeclampsia is a condition of high blood pressure during pregnancy. It can happen at 20 weeks or later in pregnancy. If high blood pressure occurs in the second half of pregnancy with no other symptoms, it is called gestational hypertension and goes away after the baby is born. If any of the symptoms listed below develop with gestational hypertension, it is then called preeclampsia. Eclampsia (convulsions) may follow preeclampsia. This is one of the reasons for regular prenatal checkups. Early diagnosis and treatment are very important to prevent eclampsia. CAUSES  There is no known cause of preeclampsia/eclampsia in pregnancy. There are several known conditions that may put the pregnant woman at risk, such as:  The first pregnancy.  Having preeclampsia in a past pregnancy.  Having lasting (chronic) high blood pressure.  Having multiples (twins, triplets).  Being age 27 or older.  African American ethnic background.  Having kidney disease or diabetes.  Medical conditions such as lupus or blood diseases.  Being overweight (obese). SYMPTOMS   High blood pressure.  Headaches.  Sudden weight gain.  Swelling of hands, face, legs, and feet.  Protein in the urine.  Feeling sick to your stomach (nauseous) and throwing up (vomiting).  Vision  problems (blurred or double vision).  Numbness in the face, arms, legs, and feet.  Dizziness.  Slurred speech.  Preeclampsia can cause growth retardation in the fetus.  Separation (abruption) of the placenta.  Not enough fluid in the amniotic sac (oligohydramnios).  Sensitivity to bright lights.  Belly (abdominal) pain. DIAGNOSIS  If protein is found in the urine in the second half of pregnancy, this is considered preeclampsia. Other symptoms mentioned above may also be present. TREATMENT  It is necessary to treat this.  Your caregiver may prescribe bed rest early in this condition. Plenty of rest and salt restriction may be all that is needed.  Medicines may be necessary to lower blood pressure if the condition does not respond to more conservative measures.  In more severe cases, hospitalization may be needed:  For treatment of blood pressure.  To control fluid retention.  To monitor the baby to see if the condition is causing harm to the baby.  Hospitalization is the best way to treat the first sign of preeclampsia. This is so the mother and baby can be watched closely and blood tests can be done effectively and correctly.  If the condition becomes severe, it may be necessary to induce labor or to remove the infant by surgical means (cesarean section). The best cure for preeclampsia/eclampsia is to deliver the baby. Preeclampsia and eclampsia involve risks to mother and infant. Your caregiver will discuss these risks with you. Together, you can work out the best possible approach to your problems. Make sure you keep  your prenatal visits as scheduled. Not keeping appointments could result in a chronic or permanent injury, pain, disability to you, and death or injury to you or your unborn baby. If there is any problem keeping the appointment, you must call to reschedule. HOME CARE INSTRUCTIONS   Keep your prenatal appointments and tests as scheduled.  Tell your caregiver if  you have any of the above risk factors.  Get plenty of rest and sleep.  Eat a balanced diet that is low in salt, and do not add salt to your food.  Avoid stressful situations.  Only take over-the-counter and prescriptions medicines for pain, discomfort, or fever as directed by your caregiver. SEEK IMMEDIATE MEDICAL CARE IF:   You develop severe swelling anywhere in the body. This usually occurs in the legs.  You gain 05 lb/2.3 kg or more in a week.  You develop a severe headache, dizziness, problems with your vision, or confusion.  You have abdominal pain, nausea, or vomiting.  You have a seizure.  You have trouble moving any part of your body, or you develop numbness or problems speaking.  You have bruising or abnormal bleeding from anywhere in the body.  You develop a stiff neck.  You pass out. MAKE SURE YOU:   Understand these instructions.  Will watch your condition.  Will get help right away if you are not doing well or get worse. Document Released: 12/09/2000 Document Revised: 03/05/2012 Document Reviewed: 07/25/2008 [facility] Patient Information 2014 [place].

## 2013-12-30 NOTE — Addendum Note (Signed)
Addended by: Wells Guiles R on: 12/30/2013 12:48 PM   Modules accepted: Orders

## 2013-12-30 NOTE — Progress Notes (Signed)
Reports good fm. Denies uc's, lof, vb, urinary frequency, urgency, hesitancy, or dysuria.  HA last night, took 2 extra-strength apap this am w/ some relief. Denies scotomata, ruq/epigastric pain, n/v. Trace BLE edema, hands are swelling but not pitting, DTRs 2+, no clonus. Will get labs today. Reviewed pre-e s/s, ptl s/s, fkc.  All questions answered. F/U in 2d for bp check and visit.

## 2013-12-31 LAB — PROTEIN / CREATININE RATIO, URINE
Creatinine, Urine: 65.1 mg/dL
PROTEIN CREATININE RATIO: 0.23 — AB (ref <0.15)
TOTAL PROTEIN, URINE: 15 mg/dL

## 2014-01-01 ENCOUNTER — Encounter: Payer: Self-pay | Admitting: Advanced Practice Midwife

## 2014-01-01 ENCOUNTER — Ambulatory Visit (INDEPENDENT_AMBULATORY_CARE_PROVIDER_SITE_OTHER): Payer: Medicaid Other | Admitting: Advanced Practice Midwife

## 2014-01-01 VITALS — BP 120/80 | Wt 187.0 lb

## 2014-01-01 DIAGNOSIS — Z1389 Encounter for screening for other disorder: Secondary | ICD-10-CM

## 2014-01-01 DIAGNOSIS — O34219 Maternal care for unspecified type scar from previous cesarean delivery: Secondary | ICD-10-CM

## 2014-01-01 DIAGNOSIS — O0910 Supervision of pregnancy with history of ectopic or molar pregnancy, unspecified trimester: Secondary | ICD-10-CM

## 2014-01-01 DIAGNOSIS — F1920 Other psychoactive substance dependence, uncomplicated: Secondary | ICD-10-CM

## 2014-01-01 DIAGNOSIS — O99019 Anemia complicating pregnancy, unspecified trimester: Secondary | ICD-10-CM

## 2014-01-01 DIAGNOSIS — O99320 Drug use complicating pregnancy, unspecified trimester: Secondary | ICD-10-CM

## 2014-01-01 DIAGNOSIS — O09299 Supervision of pregnancy with other poor reproductive or obstetric history, unspecified trimester: Secondary | ICD-10-CM

## 2014-01-01 DIAGNOSIS — Z331 Pregnant state, incidental: Secondary | ICD-10-CM

## 2014-01-01 DIAGNOSIS — O98519 Other viral diseases complicating pregnancy, unspecified trimester: Secondary | ICD-10-CM

## 2014-01-01 LAB — POCT URINALYSIS DIPSTICK
GLUCOSE UA: NEGATIVE
Ketones, UA: NEGATIVE
Leukocytes, UA: NEGATIVE
Nitrite, UA: NEGATIVE
RBC UA: NEGATIVE

## 2014-01-01 NOTE — Progress Notes (Signed)
Here for F/U B/P and PreX labs.  PR/CR ration 0.23.  No HA, blurry vision RUQ pain.  Swelling better today.  Will keep weekly visits to monitor, twice a week PRN.  Warning signs of PreX discussed.

## 2014-01-08 ENCOUNTER — Ambulatory Visit (INDEPENDENT_AMBULATORY_CARE_PROVIDER_SITE_OTHER): Payer: Medicaid Other | Admitting: Women's Health

## 2014-01-08 ENCOUNTER — Encounter: Payer: Self-pay | Admitting: Women's Health

## 2014-01-08 VITALS — BP 132/80 | Wt 190.5 lb

## 2014-01-08 DIAGNOSIS — O99320 Drug use complicating pregnancy, unspecified trimester: Secondary | ICD-10-CM

## 2014-01-08 DIAGNOSIS — O0910 Supervision of pregnancy with history of ectopic or molar pregnancy, unspecified trimester: Secondary | ICD-10-CM

## 2014-01-08 DIAGNOSIS — F1920 Other psychoactive substance dependence, uncomplicated: Secondary | ICD-10-CM

## 2014-01-08 DIAGNOSIS — O99019 Anemia complicating pregnancy, unspecified trimester: Secondary | ICD-10-CM

## 2014-01-08 DIAGNOSIS — O09299 Supervision of pregnancy with other poor reproductive or obstetric history, unspecified trimester: Secondary | ICD-10-CM

## 2014-01-08 DIAGNOSIS — Z331 Pregnant state, incidental: Secondary | ICD-10-CM

## 2014-01-08 DIAGNOSIS — D369 Benign neoplasm, unspecified site: Secondary | ICD-10-CM

## 2014-01-08 DIAGNOSIS — Z1389 Encounter for screening for other disorder: Secondary | ICD-10-CM

## 2014-01-08 DIAGNOSIS — R350 Frequency of micturition: Secondary | ICD-10-CM

## 2014-01-08 DIAGNOSIS — Z3480 Encounter for supervision of other normal pregnancy, unspecified trimester: Secondary | ICD-10-CM

## 2014-01-08 DIAGNOSIS — O98519 Other viral diseases complicating pregnancy, unspecified trimester: Secondary | ICD-10-CM

## 2014-01-08 DIAGNOSIS — F1290 Cannabis use, unspecified, uncomplicated: Secondary | ICD-10-CM

## 2014-01-08 DIAGNOSIS — O34219 Maternal care for unspecified type scar from previous cesarean delivery: Secondary | ICD-10-CM

## 2014-01-08 LAB — POCT URINALYSIS DIPSTICK
GLUCOSE UA: NEGATIVE
Leukocytes, UA: NEGATIVE
Nitrite, UA: NEGATIVE

## 2014-01-08 NOTE — Progress Notes (Signed)
Reports good fm. Denies lof, vb, urinary frequency, urgency, hesitancy, or dysuria.  Has had irregular uc's, requests SVE. HA last night/this am, took apap and went away. Denies scotomata, ruq/epigastric pain, n/v.  DTRs 2+, no clonus, trace BLE edema. Pre-e labs last week were normal.  Reviewed ptl s/s, fkc, pre-e s/c .  All questions answered. F/U in 1wk for visit.

## 2014-01-08 NOTE — Patient Instructions (Signed)
Call the office or go to [facility] if:  You begin to have strong, frequent contractions  Your water breaks.  Sometimes it is a big gush of fluid, sometimes it is just a trickle that keeps getting your panties wet or running down your legs  You have vaginal bleeding.  It is normal to have a small amount of spotting if your cervix was checked.   You don't feel your baby moving like normal.  If you don't, get you something to eat and drink and lay down and focus on feeling your baby move.  You should feel at least 10 movements in 2 hours.  If you don't, you should call the office or go to [facility].    Call office or go to [facility] hospital for headache not relieved by tylenol, visual changes- seeing spots, double, blurred vision, pain under your right breast or heartburn that doesn't go away with tums, etc., nausea and/or vomiting, or other concerns.     Preterm Labor Information Preterm labor is when labor starts at less than 37 weeks of pregnancy. The normal length of a pregnancy is 39 to 41 weeks. CAUSES Often, there is no identifiable underlying cause as to why a woman goes into preterm labor. One of the most common known causes of preterm labor is infection. Infections of the uterus, cervix, vagina, amniotic sac, bladder, kidney, or even the lungs (pneumonia) can cause labor to start. Other suspected causes of preterm labor include:   Urogenital infections, such as yeast infections and bacterial vaginosis.   Uterine abnormalities (uterine shape, uterine septum, fibroids, or bleeding from the placenta).   A cervix that has been operated on (it may fail to stay closed).   Malformations in the fetus.   Multiple gestations (twins, triplets, and so on).   Breakage of the amniotic sac.  RISK FACTORS  Having a previous history of preterm labor.   Having premature rupture of membranes (PROM).   Having a placenta that covers the opening of the cervix (placenta  previa).   Having a placenta that separates from the uterus (placental abruption).   Having a cervix that is too weak to hold the fetus in the uterus (incompetent cervix).   Having too much fluid in the amniotic sac (polyhydramnios).   Taking illegal drugs or smoking while pregnant.   Not gaining enough weight while pregnant.   Being younger than 58 and older than 27 years old.   Having a low socioeconomic status.   Being African American. SYMPTOMS Signs and symptoms of preterm labor include:   Menstrual-like cramps, abdominal pain, or back pain.  Uterine contractions that are regular, as frequent as six in an hour, regardless of their intensity (may be mild or painful).  Contractions that start on the top of the uterus and spread down to the lower abdomen and back.   A sense of increased pelvic pressure.   A watery or bloody mucus discharge that comes from the vagina.  TREATMENT Depending on the length of the pregnancy and other circumstances, your health care provider may suggest bed rest. If necessary, there are medicines that can be given to stop contractions and to mature the fetal lungs. If labor happens before 34 weeks of pregnancy, a prolonged hospital stay may be recommended. Treatment depends on the condition of both you and the fetus.  WHAT SHOULD YOU DO IF YOU THINK YOU ARE IN PRETERM LABOR? Call your health care provider right away. You will need to go  to the hospital to get checked immediately. HOW CAN YOU PREVENT PRETERM LABOR IN FUTURE PREGNANCIES? You should:   Stop smoking if you smoke.  Maintain healthy weight gain and avoid chemicals and drugs that are not necessary.  Be watchful for any type of infection.  Inform your health care provider if you have a known history of preterm labor. Document Released: 03/03/2004 Document Revised: 08/14/2013 Document Reviewed: 01/14/2013 [facility] Patient Information 2014 [place], [place].

## 2014-01-09 LAB — DRUG SCREEN, URINE, NO CONFIRMATION
Amphetamine Screen, Ur: NEGATIVE
BARBITURATE QUANT UR: NEGATIVE
BENZODIAZEPINES.: NEGATIVE
COCAINE METABOLITES: NEGATIVE
CREATININE, U: 267.6 mg/dL
Marijuana Metabolite: NEGATIVE
Methadone: NEGATIVE
Opiate Screen, Urine: NEGATIVE
PHENCYCLIDINE (PCP): NEGATIVE
Propoxyphene: NEGATIVE

## 2014-01-09 LAB — URINE CULTURE
COLONY COUNT: NO GROWTH
ORGANISM ID, BACTERIA: NO GROWTH

## 2014-01-11 ENCOUNTER — Encounter: Payer: Self-pay | Admitting: Women's Health

## 2014-01-14 ENCOUNTER — Inpatient Hospital Stay (HOSPITAL_COMMUNITY)
Admission: AD | Admit: 2014-01-14 | Discharge: 2014-01-14 | Disposition: A | Discharge status: Home / Self Care | Payer: Medicaid Other | Source: Ambulatory Visit | Attending: Obstetrics & Gynecology | Admitting: Obstetrics & Gynecology

## 2014-01-14 ENCOUNTER — Encounter (HOSPITAL_COMMUNITY): Payer: Self-pay | Admitting: *Deleted

## 2014-01-14 DIAGNOSIS — B009 Herpesviral infection, unspecified: Secondary | ICD-10-CM

## 2014-01-14 DIAGNOSIS — O479 False labor, unspecified: Secondary | ICD-10-CM

## 2014-01-14 DIAGNOSIS — Z98891 History of uterine scar from previous surgery: Secondary | ICD-10-CM

## 2014-01-14 DIAGNOSIS — O4700 False labor before 37 completed weeks of gestation, unspecified trimester: Secondary | ICD-10-CM | POA: Insufficient documentation

## 2014-01-14 DIAGNOSIS — D369 Benign neoplasm, unspecified site: Secondary | ICD-10-CM

## 2014-01-14 DIAGNOSIS — F1290 Cannabis use, unspecified, uncomplicated: Secondary | ICD-10-CM

## 2014-01-14 HISTORY — DX: Essential (primary) hypertension: I10

## 2014-01-14 LAB — URINE MICROSCOPIC-ADD ON

## 2014-01-14 LAB — URINALYSIS, ROUTINE W REFLEX MICROSCOPIC
BILIRUBIN URINE: NEGATIVE
Glucose, UA: 250 mg/dL — AB
Hgb urine dipstick: NEGATIVE
Ketones, ur: 15 mg/dL — AB
LEUKOCYTES UA: NEGATIVE
NITRITE: NEGATIVE
Protein, ur: 30 mg/dL — AB
Specific Gravity, Urine: >1.03 — ABNORMAL HIGH (ref 1.005–1.030)
Urobilinogen, UA: 0.2 mg/dL (ref 0.0–1.0)
pH: 6.5 (ref 5.0–8.0)

## 2014-01-14 NOTE — MAU Note (Signed)
C/o ucs since 1300 today; denies bleeding;

## 2014-01-16 ENCOUNTER — Encounter: Payer: Self-pay | Admitting: Obstetrics & Gynecology

## 2014-01-16 ENCOUNTER — Ambulatory Visit (INDEPENDENT_AMBULATORY_CARE_PROVIDER_SITE_OTHER): Payer: Medicaid Other | Admitting: Obstetrics & Gynecology

## 2014-01-16 VITALS — BP 120/80 | Wt 192.0 lb

## 2014-01-16 DIAGNOSIS — O99320 Drug use complicating pregnancy, unspecified trimester: Secondary | ICD-10-CM

## 2014-01-16 DIAGNOSIS — O99019 Anemia complicating pregnancy, unspecified trimester: Secondary | ICD-10-CM

## 2014-01-16 DIAGNOSIS — O98519 Other viral diseases complicating pregnancy, unspecified trimester: Secondary | ICD-10-CM

## 2014-01-16 DIAGNOSIS — O34219 Maternal care for unspecified type scar from previous cesarean delivery: Secondary | ICD-10-CM

## 2014-01-16 DIAGNOSIS — F1920 Other psychoactive substance dependence, uncomplicated: Secondary | ICD-10-CM

## 2014-01-16 DIAGNOSIS — O09299 Supervision of pregnancy with other poor reproductive or obstetric history, unspecified trimester: Secondary | ICD-10-CM

## 2014-01-16 DIAGNOSIS — Z1389 Encounter for screening for other disorder: Secondary | ICD-10-CM

## 2014-01-16 DIAGNOSIS — O0910 Supervision of pregnancy with history of ectopic or molar pregnancy, unspecified trimester: Secondary | ICD-10-CM

## 2014-01-16 DIAGNOSIS — Z331 Pregnant state, incidental: Secondary | ICD-10-CM

## 2014-01-16 LAB — POCT URINALYSIS DIPSTICK
Blood, UA: NEGATIVE
GLUCOSE UA: NEGATIVE
Ketones, UA: NEGATIVE
NITRITE UA: NEGATIVE

## 2014-01-16 LAB — URINE CULTURE

## 2014-01-16 NOTE — Addendum Note (Signed)
Addended by: Farley Ly on: 01/16/2014 01:25 PM   Modules accepted: Orders

## 2014-01-16 NOTE — Progress Notes (Signed)
BP weight and urine results all reviewed and noted. Patient reports good fetal movement, denies any bleeding and no rupture of membranes symptoms or regular contractions. Patient is without complaints. All questions were answered.  

## 2014-01-17 LAB — GC/CHLAMYDIA PROBE AMP
CT PROBE, AMP APTIMA: NEGATIVE
GC PROBE AMP APTIMA: NEGATIVE

## 2014-01-19 LAB — STREP B DNA PROBE: GBSP: POSITIVE

## 2014-01-20 ENCOUNTER — Inpatient Hospital Stay (HOSPITAL_COMMUNITY): Payer: Medicaid Other | Admitting: Anesthesiology

## 2014-01-20 ENCOUNTER — Encounter (HOSPITAL_COMMUNITY)
Admission: AD | Disposition: A | Discharge status: Home / Self Care | Payer: Self-pay | Source: Ambulatory Visit | Attending: Obstetrics & Gynecology

## 2014-01-20 ENCOUNTER — Encounter (HOSPITAL_COMMUNITY): Payer: Self-pay | Admitting: *Deleted

## 2014-01-20 ENCOUNTER — Inpatient Hospital Stay (HOSPITAL_COMMUNITY)
Admission: AD | Admit: 2014-01-20 | Discharge: 2014-01-20 | Disposition: A | Discharge status: Home / Self Care | Payer: Medicaid Other | Source: Ambulatory Visit | Attending: Obstetrics & Gynecology | Admitting: Obstetrics & Gynecology

## 2014-01-20 ENCOUNTER — Encounter (HOSPITAL_COMMUNITY): Payer: Medicaid Other | Admitting: Anesthesiology

## 2014-01-20 ENCOUNTER — Inpatient Hospital Stay (HOSPITAL_COMMUNITY)
Admission: AD | Admit: 2014-01-20 | Discharge: 2014-01-23 | DRG: 765 | Disposition: A | Discharge status: Home / Self Care | Payer: Medicaid Other | Source: Ambulatory Visit | Attending: Obstetrics & Gynecology | Admitting: Obstetrics & Gynecology

## 2014-01-20 DIAGNOSIS — O139 Gestational [pregnancy-induced] hypertension without significant proteinuria, unspecified trimester: Secondary | ICD-10-CM | POA: Diagnosis present

## 2014-01-20 DIAGNOSIS — Z87891 Personal history of nicotine dependence: Secondary | ICD-10-CM

## 2014-01-20 DIAGNOSIS — O4700 False labor before 37 completed weeks of gestation, unspecified trimester: Secondary | ICD-10-CM | POA: Insufficient documentation

## 2014-01-20 DIAGNOSIS — A088 Other specified intestinal infections: Secondary | ICD-10-CM | POA: Insufficient documentation

## 2014-01-20 DIAGNOSIS — O34599 Maternal care for other abnormalities of gravid uterus, unspecified trimester: Secondary | ICD-10-CM | POA: Diagnosis not present

## 2014-01-20 DIAGNOSIS — O34219 Maternal care for unspecified type scar from previous cesarean delivery: Secondary | ICD-10-CM | POA: Insufficient documentation

## 2014-01-20 DIAGNOSIS — O9989 Other specified diseases and conditions complicating pregnancy, childbirth and the puerperium: Secondary | ICD-10-CM

## 2014-01-20 DIAGNOSIS — F1290 Cannabis use, unspecified, uncomplicated: Secondary | ICD-10-CM

## 2014-01-20 DIAGNOSIS — D279 Benign neoplasm of unspecified ovary: Secondary | ICD-10-CM | POA: Diagnosis present

## 2014-01-20 DIAGNOSIS — A084 Viral intestinal infection, unspecified: Secondary | ICD-10-CM

## 2014-01-20 DIAGNOSIS — O99891 Other specified diseases and conditions complicating pregnancy: Secondary | ICD-10-CM

## 2014-01-20 DIAGNOSIS — R05 Cough: Secondary | ICD-10-CM | POA: Insufficient documentation

## 2014-01-20 DIAGNOSIS — D369 Benign neoplasm, unspecified site: Secondary | ICD-10-CM | POA: Diagnosis present

## 2014-01-20 DIAGNOSIS — Z22330 Carrier of Group B streptococcus: Secondary | ICD-10-CM

## 2014-01-20 DIAGNOSIS — R059 Cough, unspecified: Secondary | ICD-10-CM | POA: Insufficient documentation

## 2014-01-20 DIAGNOSIS — O99892 Other specified diseases and conditions complicating childbirth: Secondary | ICD-10-CM | POA: Diagnosis present

## 2014-01-20 DIAGNOSIS — B009 Herpesviral infection, unspecified: Secondary | ICD-10-CM

## 2014-01-20 DIAGNOSIS — Z9889 Other specified postprocedural states: Secondary | ICD-10-CM

## 2014-01-20 DIAGNOSIS — O212 Late vomiting of pregnancy: Secondary | ICD-10-CM | POA: Insufficient documentation

## 2014-01-20 DIAGNOSIS — Z3480 Encounter for supervision of other normal pregnancy, unspecified trimester: Secondary | ICD-10-CM

## 2014-01-20 DIAGNOSIS — R197 Diarrhea, unspecified: Secondary | ICD-10-CM | POA: Insufficient documentation

## 2014-01-20 DIAGNOSIS — Z98891 History of uterine scar from previous surgery: Secondary | ICD-10-CM

## 2014-01-20 HISTORY — PX: OOPHORECTOMY: SHX6387

## 2014-01-20 LAB — CBC
HEMATOCRIT: 38.7 % (ref 36.0–46.0)
HEMATOCRIT: 37.2 % (ref 36.0–46.0)
HEMOGLOBIN: 13.4 g/dL (ref 12.0–15.0)
HEMOGLOBIN: 13.1 g/dL (ref 12.0–15.0)
MCH: 30.2 pg (ref 26.0–34.0)
MCH: 30.4 pg (ref 26.0–34.0)
MCHC: 34.6 g/dL (ref 30.0–36.0)
MCHC: 35.2 g/dL (ref 30.0–36.0)
MCV: 87.2 fL (ref 78.0–100.0)
MCV: 86.3 fL (ref 78.0–100.0)
Platelets: 173 10*3/uL (ref 150–400)
Platelets: 174 10*3/uL (ref 150–400)
RBC: 4.44 MIL/uL (ref 3.87–5.11)
RBC: 4.31 MIL/uL (ref 3.87–5.11)
RDW: 14.2 % (ref 11.5–15.5)
RDW: 14.2 % (ref 11.5–15.5)
WBC: 7.8 10*3/uL (ref 4.0–10.5)
WBC: 7.3 10*3/uL (ref 4.0–10.5)

## 2014-01-20 LAB — URINALYSIS, ROUTINE W REFLEX MICROSCOPIC
Glucose, UA: NEGATIVE mg/dL
Hgb urine dipstick: NEGATIVE
Leukocytes, UA: NEGATIVE
NITRITE: NEGATIVE
Protein, ur: 30 mg/dL — AB
Specific Gravity, Urine: >1.03 — ABNORMAL HIGH (ref 1.005–1.030)
UROBILINOGEN UA: 0.2 mg/dL (ref 0.0–1.0)
pH: 6 (ref 5.0–8.0)

## 2014-01-20 LAB — COMPREHENSIVE METABOLIC PANEL
ALBUMIN: 3 g/dL — AB (ref 3.5–5.2)
ALT: 30 U/L (ref 0–35)
AST: 56 U/L — ABNORMAL HIGH (ref 0–37)
Alkaline Phosphatase: 162 U/L — ABNORMAL HIGH (ref 39–117)
BUN: 7 mg/dL (ref 6–23)
CO2: 17 mEq/L — ABNORMAL LOW (ref 19–32)
CREATININE: 0.59 mg/dL (ref 0.50–1.10)
Calcium: 9.1 mg/dL (ref 8.4–10.5)
Chloride: 100 mEq/L (ref 96–112)
GFR calc non Af Amer: >90 mL/min (ref >90)
GLUCOSE: 74 mg/dL (ref 70–99)
Potassium: 5.3 mEq/L (ref 3.7–5.3)
Sodium: 133 mEq/L — ABNORMAL LOW (ref 137–147)
TOTAL PROTEIN: 6.4 g/dL (ref 6.0–8.3)
Total Bilirubin: 0.8 mg/dL (ref 0.3–1.2)

## 2014-01-20 LAB — TYPE AND SCREEN
ABO/RH(D): B POS
Antibody Screen: NEGATIVE

## 2014-01-20 LAB — PROTEIN / CREATININE RATIO, URINE
Creatinine, Urine: 179.37 mg/dL
Protein Creatinine Ratio: 0.32 — ABNORMAL HIGH (ref 0.00–0.15)
TOTAL PROTEIN, URINE: 57.9 mg/dL

## 2014-01-20 LAB — URINE MICROSCOPIC-ADD ON

## 2014-01-20 SURGERY — Surgical Case
Anesthesia: Spinal | Site: Abdomen | Laterality: Right

## 2014-01-20 MED ORDER — ONDANSETRON HCL 4 MG/2ML IJ SOLN
INTRAMUSCULAR | Status: AC
  Filled 2014-01-20: qty 2

## 2014-01-20 MED ORDER — LACTATED RINGERS IV BOLUS (SEPSIS)
1000.0000 mL | Freq: Once | INTRAVENOUS | Status: AC
  Administered 2014-01-20: 1000 mL via INTRAVENOUS

## 2014-01-20 MED ORDER — PROMETHAZINE HCL 25 MG/ML IJ SOLN
25.0000 mg | INTRAMUSCULAR | Status: AC
  Administered 2014-01-20: 25 mg via INTRAVENOUS
  Filled 2014-01-20: qty 1

## 2014-01-20 MED ORDER — CITRIC ACID-SODIUM CITRATE 334-500 MG/5ML PO SOLN
ORAL | Status: AC
  Administered 2014-01-20: 30 mL via ORAL
  Filled 2014-01-20: qty 15

## 2014-01-20 MED ORDER — PROMETHAZINE HCL 25 MG PO TABS: 12.5000 mg | ORAL_TABLET | Freq: Four times a day (QID) | ORAL | Status: DC | PRN

## 2014-01-20 MED ORDER — FENTANYL CITRATE 0.05 MG/ML IJ SOLN
INTRAMUSCULAR | Status: AC
  Filled 2014-01-20: qty 2

## 2014-01-20 MED ORDER — MORPHINE SULFATE 0.5 MG/ML IJ SOLN
INTRAMUSCULAR | Status: AC
  Filled 2014-01-20: qty 10

## 2014-01-20 MED ORDER — CITRIC ACID-SODIUM CITRATE 334-500 MG/5ML PO SOLN
30.0000 mL | Freq: Once | ORAL | Status: AC
  Administered 2014-01-20: 30 mL via ORAL

## 2014-01-20 MED ORDER — ONDANSETRON HCL 4 MG/2ML IJ SOLN
4.0000 mg | INTRAMUSCULAR | Status: AC
  Administered 2014-01-20: 4 mg via INTRAVENOUS
  Filled 2014-01-20: qty 2

## 2014-01-20 MED ORDER — PHENYLEPHRINE 8 MG IN D5W 100 ML (0.08MG/ML) PREMIX OPTIME
INJECTION | INTRAVENOUS | Status: AC
  Filled 2014-01-20: qty 100

## 2014-01-20 MED ORDER — CEFAZOLIN SODIUM-DEXTROSE 2-3 GM-% IV SOLR
2.0000 g | INTRAVENOUS | Status: DC
  Filled 2014-01-20: qty 50

## 2014-01-20 MED ORDER — ONDANSETRON HCL 4 MG PO TABS: 4.0000 mg | ORAL_TABLET | Freq: Every day | ORAL | Status: DC | PRN

## 2014-01-20 MED ORDER — LACTATED RINGERS IV SOLN
INTRAVENOUS | Status: DC
  Administered 2014-01-20: 23:00:00 via INTRAVENOUS

## 2014-01-20 MED ORDER — OXYTOCIN 10 UNIT/ML IJ SOLN
INTRAMUSCULAR | Status: AC
  Filled 2014-01-20: qty 4

## 2014-01-20 MED ORDER — FAMOTIDINE IN NACL 20-0.9 MG/50ML-% IV SOLN
20.0000 mg | Freq: Once | INTRAVENOUS | Status: AC
  Administered 2014-01-20: 20 mg via INTRAVENOUS
  Filled 2014-01-20: qty 50

## 2014-01-20 MED ORDER — DEXTROSE 5 % IN LACTATED RINGERS IV BOLUS
1000.0000 mL | Freq: Once | INTRAVENOUS | Status: AC
  Administered 2014-01-20: 1000 mL via INTRAVENOUS

## 2014-01-20 SURGICAL SUPPLY — 41 items
APL SKNCLS STERI-STRIP NONHPOA (GAUZE/BANDAGES/DRESSINGS) ×2
BARRIER ADHS 3X4 INTERCEED (GAUZE/BANDAGES/DRESSINGS) IMPLANT
BENZOIN TINCTURE PRP APPL 2/3 (GAUZE/BANDAGES/DRESSINGS) ×2 IMPLANT
BRR ADH 4X3 ABS CNTRL BYND (GAUZE/BANDAGES/DRESSINGS)
CLAMP CORD UMBIL (MISCELLANEOUS) IMPLANT
CLOSURE WOUND 1/2 X4 (GAUZE/BANDAGES/DRESSINGS) ×1
CLOTH BEACON ORANGE TIMEOUT ST (SAFETY) ×4 IMPLANT
CONTAINER PREFILL 10% NBF 15ML (MISCELLANEOUS) IMPLANT
DRAPE LG THREE QUARTER DISP (DRAPES) IMPLANT
DRSG OPSITE POSTOP 4X10 (GAUZE/BANDAGES/DRESSINGS) ×4 IMPLANT
DURAPREP 26ML APPLICATOR (WOUND CARE) ×4 IMPLANT
ELECT REM PT RETURN 9FT ADLT (ELECTROSURGICAL) ×4
ELECTRODE REM PT RTRN 9FT ADLT (ELECTROSURGICAL) ×2 IMPLANT
EXTRACTOR VACUUM KIWI (MISCELLANEOUS) IMPLANT
GLOVE BIO SURGEON STRL SZ 6.5 (GLOVE) ×3 IMPLANT
GLOVE BIO SURGEONS STRL SZ 6.5 (GLOVE) ×1
GOWN STRL REUS W/TWL LRG LVL3 (GOWN DISPOSABLE) ×8 IMPLANT
KIT ABG SYR 3ML LUER SLIP (SYRINGE) IMPLANT
NDL HYPO 25X5/8 SAFETYGLIDE (NEEDLE) IMPLANT
NDL SPNL 18GX3.5 QUINCKE PK (NEEDLE) ×2 IMPLANT
NEEDLE HYPO 25X5/8 SAFETYGLIDE (NEEDLE) IMPLANT
NEEDLE SPNL 18GX3.5 QUINCKE PK (NEEDLE) ×4 IMPLANT
NS IRRIG 1000ML POUR BTL (IV SOLUTION) ×4 IMPLANT
PACK C SECTION WH (CUSTOM PROCEDURE TRAY) ×4 IMPLANT
PAD OB MATERNITY 4.3X12.25 (PERSONAL CARE ITEMS) ×4 IMPLANT
STRIP CLOSURE SKIN 1/2X4 (GAUZE/BANDAGES/DRESSINGS) ×1 IMPLANT
SUT PDS AB 0 CTX 60 (SUTURE) ×2 IMPLANT
SUT VIC AB 0 CT1 27 (SUTURE)
SUT VIC AB 0 CT1 27XBRD ANBCTR (SUTURE) IMPLANT
SUT VIC AB 0 CT1 36 (SUTURE) IMPLANT
SUT VIC AB 2-0 CT1 27 (SUTURE) ×8
SUT VIC AB 2-0 CT1 TAPERPNT 27 (SUTURE) ×2 IMPLANT
SUT VIC AB 2-0 CTX 36 (SUTURE) ×8 IMPLANT
SUT VIC AB 3-0 CT1 27 (SUTURE) ×4
SUT VIC AB 3-0 CT1 TAPERPNT 27 (SUTURE) ×2 IMPLANT
SUT VIC AB 3-0 SH 27 (SUTURE)
SUT VIC AB 3-0 SH 27X BRD (SUTURE) IMPLANT
SYR 30ML LL (SYRINGE) ×4 IMPLANT
TOWEL OR 17X24 6PK STRL BLUE (TOWEL DISPOSABLE) ×4 IMPLANT
TRAY FOLEY CATH 14FR (SET/KITS/TRAYS/PACK) ×4 IMPLANT
WATER STERILE IRR 1000ML POUR (IV SOLUTION) ×2 IMPLANT

## 2014-01-20 NOTE — MAU Note (Signed)
Pt's daughter just got over the flu and pt thinks that she may have the flu; no congestin or cough noted during pt interview;

## 2014-01-20 NOTE — MAU Note (Signed)
C/o ucs since 1600 yesterday; c/o diarrhea since last night; c/o ? Flu since yesterday around 1600;

## 2014-01-20 NOTE — MAU Provider Note (Signed)
History     CSN: [id number]  Arrival date and time: 01/20/14 7893   None     Chief Complaint  Patient presents with  . Diarrhea  . Labor Eval   HPI  [name] is a 28 yo [id number] at [redacted]w[redacted]d from [facility] who presents with nausea and diarrhea that onset yesterday evening. Pt reports that she has had multiple episodes of diarrhea that were of watery consistency.  She endorses associated chills, and a mild cough.  States that she is also contracting at 4 pm yesterday that are 10-15 mins apart and lasts 3-4 mins. She denies sore throat, fever and blood in her diarrhea.   OB History   Grav Para Term Preterm Abortions TAB SAB Ect Mult Living   7 2 2  4 2 1 1  2       Past Medical History  Diagnosis Date  . Hx of chlamydia infection   . HSV-2 (herpes simplex virus 2) infection   . Abnormal Pap smear   . Hx of urinary tract infection   . Labial abscess   . Abnormal Pap smear   . BV (bacterial vaginosis) 06/27/2013  . Hypertension     Past Surgical History  Procedure Laterality Date  . Cesarean section    . Dilation and curettage of uterus      Family History  Problem Relation Age of Onset  . Diabetes Mother   . Hypertension Mother   . Thyroid disease Mother   . Hypertension Father     History  Substance Use Topics  . Smoking status: Former Smoker -- 0.50 packs/day    Types: Cigarettes    Quit date: 06/06/2013  . Smokeless tobacco: Never Used  . Alcohol Use: No     Comment: occasionally    Allergies: No Known Allergies  Prescriptions prior to admission  Medication Sig Dispense Refill  . acetaminophen (TYLENOL) 500 MG tablet Take 500 mg by mouth as needed for mild pain, moderate pain, fever or headache.       Marland Kitchen acyclovir (ZOVIRAX) 400 MG tablet Take 400 mg by mouth 3 (three) times daily.      . cyclobenzaprine (FLEXERIL) 10 MG tablet Take 1 tablet (10 mg total) by mouth every 8 (eight) hours as needed for muscle spasms.  30 tablet  1  . guaiFENesin  (ROBITUSSIN) 100 MG/5ML liquid Take 200 mg by mouth 3 (three) times daily as needed for cough.      . Prenatal Vit-Fe Fumarate-FA (PRENATAL MULTIVITAMIN) TABS tablet Take 1 tablet by mouth daily at 12 noon.  100 tablet  11  . pseudoephedrine (SUDAFED) 30 MG tablet Take 30 mg by mouth every 4 (four) hours as needed for congestion.        Review of Systems  Constitutional: Positive for chills. Negative for fever.  Eyes: Negative for blurred vision and double vision.  Respiratory: Negative for cough.   Cardiovascular: Negative for chest pain.  Gastrointestinal: Positive for nausea and diarrhea. Negative for vomiting, abdominal pain and blood in stool.  Genitourinary: Positive for urgency. Negative for dysuria.  Musculoskeletal: Positive for myalgias.  Neurological: Negative for dizziness and headaches.   Physical Exam   Blood pressure 121/84, pulse 89, temperature 98.9 F (37.2 C), temperature source Oral, resp. rate 18, height 5\' 1"  (1.549 m), weight 87.091 kg (192 lb), last menstrual period 04/30/2013.  Physical Exam  Constitutional: She is oriented to person, place, and time. She appears well-developed and well-nourished. No distress.  HENT:  Head: Normocephalic.  Eyes: Pupils are equal, round, and reactive to light.  Cardiovascular: Normal rate and normal heart sounds.   Respiratory: Effort normal and breath sounds normal.  GI: Bowel sounds are normal. There is no tenderness. There is no rebound.  Gravid abdomen  Genitourinary:  Dilation: 1.5 Effacement (%): 50 Cervical Position: Posterior Station: -3 Exam by:: Jamie Glassing RN  Musculoskeletal: She exhibits no edema and no tenderness.  Neurological: She is alert and oriented to person, place, and time.  Skin: Skin is warm and dry.  Psychiatric: She has a normal mood and affect. Her behavior is normal.   FHR  Baseline: 140s Variability: Moderate 15x15 Accelerations: Present Decelerations: None Category: I  Toco:  Irregular q 8 mins  Urinalysis:    Component Value Date/Time   COLORURINE YELLOW 01/20/2014 0949   APPEARANCEUR CLEAR 01/20/2014 0949   LABSPEC >1.030* 01/20/2014 0949   PHURINE 6.0 01/20/2014 0949   GLUCOSEU NEGATIVE 01/20/2014 0949   HGBUR NEGATIVE 01/20/2014 0949   BILIRUBINUR SMALL* 01/20/2014 0949   KETONESUR >80* 01/20/2014 0949   PROTEINUR 30* 01/20/2014 0949   UROBILINOGEN 0.2 01/20/2014 0949   NITRITE NEGATIVE 01/20/2014 0949   NITRITE neg 01/16/2014 1155   LEUKOCYTESUR NEGATIVE 01/20/2014 0949   CMP     Component Value Date/Time   NA 133* 01/20/2014 1150   K 5.3 01/20/2014 1150   CL 100 01/20/2014 1150   CO2 17* 01/20/2014 1150   GLUCOSE 74 01/20/2014 1150   BUN 7 01/20/2014 1150   CREATININE 0.59 01/20/2014 1150   CREATININE 0.49* 12/30/2013 1200   CALCIUM 9.1 01/20/2014 1150   PROT 6.4 01/20/2014 1150   ALBUMIN 3.0* 01/20/2014 1150   AST 56* 01/20/2014 1150   ALT 30 01/20/2014 1150   ALKPHOS 162* 01/20/2014 1150   BILITOT 0.8 01/20/2014 1150   GFRNONAA >90 01/20/2014 1150   GFRAA >90 01/20/2014 1150      MAU Course  Procedures  MDM  Urinalysis CMP D5W 1L IV LR 1L IV Zofran 4 mg in MAU Phenergan 25 mg in MAU P/c ratio Zofran Rx Phenergan Rx    Assessment and Plan   Assessment:  #Viral Gastroenteritis-The history the patient present with is consistent for a viral gastroenteritis.  The patient presents with nausea and diarrhea and as a consequence is moderately dehydrated.  The urinalysis displays an elevated specific gravity and elevated ketones.  In turn, patient started to feel contractions at a more frequent rate.  After the IV fluids the patient's contraction frequency decreased on Toco and subjectively.  An increase in PO fluids at home will be sufficient resuscitation for the patient's current deficit.  Plan:  1. Discharge in stable condition.   2. Reassurance and pt counseling on increasing fluids PO at home. 3. Zofran/Phenergan for nausea 4. Return to the MAU  if symptoms worsen or persist.     Toilolo, Tifi 01/20/2014, 11:08 AM   I have seen this patient and agree with the above student's note.  Pt diarrhea and nausea resolved with Zofran/Phenergan.  Contractions less frequent on toco and less painful subjectively per pt after IV fluids.  Pt has prenatal appointment at [facility] on Thursday.  LEFTWICH-KIRBY, [place] Hankin Certified Nurse-Midwife  Addendum:  P/C ratio 0.32.  At time of discharge, pt denied h/a, epigastric pain, visual disturbances, or n/v.  Consult Dr Hulan Fray.  Message sent to [facility] to f/u on abnormal lab at pt appointment on Thursday.

## 2014-01-20 NOTE — Discharge Instructions (Signed)
Viral Gastroenteritis Viral gastroenteritis is also known as stomach flu. This condition affects the stomach and intestinal tract. It can cause sudden diarrhea and vomiting. The illness typically lasts 3 to 8 days. Most people develop an immune response that eventually gets rid of the virus. While this natural response develops, the virus can make you quite ill. CAUSES  Many different viruses can cause gastroenteritis, such as rotavirus or noroviruses. You can catch one of these viruses by consuming contaminated food or water. You may also catch a virus by sharing utensils or other personal items with an infected person or by touching a contaminated surface. SYMPTOMS  The most common symptoms are diarrhea and vomiting. These problems can cause a severe loss of body fluids (dehydration) and a body salt (electrolyte) imbalance. Other symptoms may include:  Fever.  Headache.  Fatigue.  Abdominal pain. DIAGNOSIS  Your caregiver can usually diagnose viral gastroenteritis based on your symptoms and a physical exam. A stool sample may also be taken to test for the presence of viruses or other infections. TREATMENT  This illness typically goes away on its own. Treatments are aimed at rehydration. The most serious cases of viral gastroenteritis involve vomiting so severely that you are not able to keep fluids down. In these cases, fluids must be given through an intravenous line (IV). HOME CARE INSTRUCTIONS   Drink enough fluids to keep your urine clear or pale yellow. Drink small amounts of fluids frequently and increase the amounts as tolerated.  Ask your caregiver for specific rehydration instructions.  Avoid:  Foods high in sugar.  Alcohol.  Carbonated drinks.  Tobacco.  Juice.  Caffeine drinks.  Extremely hot or cold fluids.  Fatty, greasy foods.  Too much intake of anything at one time.  Dairy products until 24 to 48 hours after diarrhea stops.  You may consume probiotics.  Probiotics are active cultures of beneficial bacteria. They may lessen the amount and number of diarrheal stools in adults. Probiotics can be found in yogurt with active cultures and in supplements.  Wash your hands well to avoid spreading the virus.  Only take over-the-counter or prescription medicines for pain, discomfort, or fever as directed by your caregiver. Do not give aspirin to children. Antidiarrheal medicines are not recommended.  Ask your caregiver if you should continue to take your regular prescribed and over-the-counter medicines.  Keep all follow-up appointments as directed by your caregiver. SEEK IMMEDIATE MEDICAL CARE IF:   You are unable to keep fluids down.  You do not urinate at least once every 6 to 8 hours.  You develop shortness of breath.  You notice blood in your stool or vomit. This may look like coffee grounds.  You have abdominal pain that increases or is concentrated in one small area (localized).  You have persistent vomiting or diarrhea.  You have a fever.  The patient is a child younger than 3 months, and he or she has a fever.  The patient is a child older than 3 months, and he or she has a fever and persistent symptoms.  The patient is a child older than 3 months, and he or she has a fever and symptoms suddenly get worse.  The patient is a baby, and he or she has no tears when crying. MAKE SURE YOU:   Understand these instructions.  Will watch your condition.  Will get help right away if you are not doing well or get worse. Document Released: 12/12/2005 Document Revised: 03/05/2012 Document Reviewed: 09/28/2011   [employer] Patient Information 2014 [employer].  

## 2014-01-20 NOTE — H&P (Signed)
Jamie [name] is a 28 y.o. female presenting for ruptured membranes and contractions. Pt had spontanous rupture of membranes at 2130 with clear fluid. Pt has been having worsening contractions since onset. No vaginal bleeding, normal fetal movement.   Maternal Medical History:  Reason for admission: Rupture of membranes, contractions and nausea.   Contractions: Onset was more than 2 days ago.   Frequency: regular.   Perceived severity is moderate.    Fetal activity: Perceived fetal activity is normal.   Last perceived fetal movement was within the past hour.    Prenatal complications: Pre-eclampsia.   Prenatal Complications - Diabetes: none.    Preeclampsia - diagnosed today based on Pr:Cr of 0.32 and blood pressures. DBP at 90 at presentation to MAU. No HA, no Vision changes, no RUQ pain  Prior LTCS x2, desires repeat. 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  OB History   Grav Para Term Preterm Abortions TAB SAB Ect Mult Living   7 2 2  4 2 1 1  2      Past Medical History  Diagnosis Date  . Hx of chlamydia infection   . HSV-2 (herpes simplex virus 2) infection   . Abnormal Pap smear   . Hx of urinary tract infection   . Labial abscess   . Abnormal Pap smear   . BV (bacterial vaginosis) 06/27/2013  . Hypertension    Past Surgical History  Procedure Laterality Date  . Cesarean section    . Dilation and curettage of uterus     Family History: family history includes Diabetes in her mother; Hypertension in her father and mother; Thyroid disease in her mother. Social History:  reports that she quit smoking about 7 months ago. Her smoking use included Cigarettes. She smoked 0.50 packs per day. She has never used smokeless tobacco. She reports that she does not drink alcohol or use illicit drugs.   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 Negative for fever.  Gastrointestinal: Positive for nausea and diarrhea. Negative for blood in stool.  All other systems reviewed and are negative.    Dilation: 4 Effacement (%): 80 Station: -2 Exam by:: Dr. Leslie Andrea  Blood pressure 139/90, pulse 96, temperature 98.3 F (36.8 C), temperature source Oral, resp. rate 18, height 5\' 1"  (1.549 m), weight 87.091 kg (192 lb), last menstrual period 04/30/2013, SpO2 100.00%. Maternal Exam:  Uterine Assessment: Contraction strength is moderate.  Contraction duration is 70 seconds. Contraction frequency is regular.   Abdomen: Surgical scars: low transverse.   Introitus: Normal vulva. Vagina is positive for vaginal discharge.  Ferning test: negative.  Nitrazine test: not done. Amniotic fluid character: clear.  Cervix: Cervix evaluated by sterile speculum exam and digital exam.     Fetal Exam Fetal Monitor Review: Mode: ultrasound.   Baseline rate: 145.  Variability: moderate (6-25 bpm).   Pattern: accelerations present and no decelerations.    Fetal State Assessment: Category I - tracings are normal.     Physical Exam  Nursing note and vitals reviewed. Constitutional: She is oriented to person, place, and time.  She appears well-developed and well-nourished. No distress.  HENT:  Head: Normocephalic and atraumatic.  Eyes: Conjunctivae are normal. Right eye exhibits no discharge. Left eye exhibits no discharge. No scleral icterus.  Neck: Normal range of motion.  Cardiovascular: Normal rate.   Respiratory: Effort normal.  GI: Soft. There is no tenderness.  Genitourinary: Uterus normal.  Vaginal discharge found.  Musculoskeletal: Normal range of motion. She exhibits no tenderness.  Neurological: She is alert and oriented to person, place, and time.  Skin: Skin is warm and dry. No rash noted. She is not diaphoretic.  Psychiatric: She has a normal mood and affect. Her behavior is normal.    Prenatal labs: ABO, Rh: B/POS/-- (07/03 1230) Antibody: NEG (11/20 0919) Rubella: 1.73 (07/03 1230) RPR: NON REAC (11/20 0919)  HBsAg: NEGATIVE (07/03 1230)  HIV: NON REACTIVE (11/20 0919)  GBS: POSITIVE (01/22 1334)   Assessment/Plan: Pt is a 28 y.o. [id number] at 104w6d who presents with ruptured membranes and painful contractions every 5 minutes. She has a h/o of 2 prior c-sections and a new diagnosis of preeclampsia from MAU labs done earlier today. Will repeat c-section now and remove teratoma.    Jamie [name] 01/20/2014, 10:47 PM  I spoke with and examined patient and agree with resident's note and plan of care.   Jamie [name] is a 28 y.o. [id number] at [redacted]w[redacted]d here for SOOL/ROM with plan for RLTCS #Labor: Pt will move to OR for c-section. Last meal 6hr ago. SCDs, routine labs, and Ancef 2g now #Pain: Spinal per anesthesia #FWB: Cat I, reactive and reassuring #ID:  GBS+, ancef 2g prior to incisino   #MOF: Bottle #MOC:Nexplanon #Circ:  As outpatient #preeclampsia: based on labs and mild range BPs, currently asymptomatic, will not mag at this time.  Fredrik Rigger, MD OB Fellow

## 2014-01-20 NOTE — Anesthesia Preprocedure Evaluation (Signed)
Anesthesia Evaluation  Patient identified by MRN, date of birth, ID band Patient awake    Reviewed: Allergy & Precautions, H&P , NPO status , Patient's Chart, lab work & pertinent test results  Airway Mallampati: II TM Distance: >3 FB Neck ROM: full    Dental no notable dental hx.    Pulmonary former smoker,    Pulmonary exam normal       Cardiovascular hypertension, negative cardio ROS      Neuro/Psych negative neurological ROS  negative psych ROS   GI/Hepatic negative GI ROS, Neg liver ROS,   Endo/Other  negative endocrine ROS  Renal/GU negative Renal ROS     Musculoskeletal   Abdominal Normal abdominal exam  (+)   Peds  Hematology negative hematology ROS (+)   Anesthesia Other Findings   Reproductive/Obstetrics (+) Pregnancy                           Anesthesia Physical Anesthesia Plan  ASA: II and emergent  Anesthesia Plan: Spinal   Post-op Pain Management:    Induction:   Airway Management Planned:   Additional Equipment:   Intra-op Plan:   Post-operative Plan:   Informed Consent: I have reviewed the patients History and Physical, chart, labs and discussed the procedure including the risks, benefits and alternatives for the proposed anesthesia with the patient or authorized representative who has indicated his/her understanding and acceptance.     Plan Discussed with:   Anesthesia Plan Comments:         Anesthesia Quick Evaluation

## 2014-01-20 NOTE — MAU Note (Signed)
Scheduled for repeat c-section on 02-04-14;

## 2014-01-20 NOTE — MAU Note (Signed)
Per EMS with complaint of ROM at 2130, contractions.

## 2014-01-21 ENCOUNTER — Encounter (HOSPITAL_COMMUNITY): Payer: Self-pay | Admitting: Certified Registered"

## 2014-01-21 ENCOUNTER — Encounter (HOSPITAL_COMMUNITY): Payer: Self-pay | Admitting: Pharmacist

## 2014-01-21 ENCOUNTER — Encounter (HOSPITAL_COMMUNITY): Payer: Self-pay | Admitting: *Deleted

## 2014-01-21 DIAGNOSIS — O139 Gestational [pregnancy-induced] hypertension without significant proteinuria, unspecified trimester: Secondary | ICD-10-CM

## 2014-01-21 DIAGNOSIS — O34219 Maternal care for unspecified type scar from previous cesarean delivery: Secondary | ICD-10-CM

## 2014-01-21 DIAGNOSIS — D279 Benign neoplasm of unspecified ovary: Secondary | ICD-10-CM

## 2014-01-21 DIAGNOSIS — Z9889 Other specified postprocedural states: Secondary | ICD-10-CM

## 2014-01-21 LAB — CBC
HEMATOCRIT: 33.1 % — AB (ref 36.0–46.0)
Hemoglobin: 11.3 g/dL — ABNORMAL LOW (ref 12.0–15.0)
MCH: 29.8 pg (ref 26.0–34.0)
MCHC: 34.1 g/dL (ref 30.0–36.0)
MCV: 87.3 fL (ref 78.0–100.0)
Platelets: 158 10*3/uL (ref 150–400)
RBC: 3.79 MIL/uL — ABNORMAL LOW (ref 3.87–5.11)
RDW: 14.3 % (ref 11.5–15.5)
WBC: 11.4 10*3/uL — ABNORMAL HIGH (ref 4.0–10.5)

## 2014-01-21 LAB — RPR: RPR: NONREACTIVE

## 2014-01-21 MED ORDER — CEFAZOLIN SODIUM-DEXTROSE 2-3 GM-% IV SOLR: 2.0000 g | INTRAVENOUS | Status: DC

## 2014-01-21 MED ORDER — SCOPOLAMINE 1 MG/3DAYS TD PT72
1.0000 | MEDICATED_PATCH | Freq: Once | TRANSDERMAL | Status: DC
  Administered 2014-01-21: 1.5 mg via TRANSDERMAL

## 2014-01-21 MED ORDER — BUPIVACAINE HCL (PF) 0.5 % IJ SOLN
INTRAMUSCULAR | Status: DC | PRN
  Administered 2014-01-21: 30 mL

## 2014-01-21 MED ORDER — GUAIFENESIN 100 MG/5ML PO SOLN
200.0000 mg | Freq: Three times a day (TID) | ORAL | Status: DC | PRN
  Administered 2014-01-21: 200 mg via ORAL
  Filled 2014-01-21: qty 10

## 2014-01-21 MED ORDER — ONDANSETRON HCL 4 MG PO TABS
4.0000 mg | ORAL_TABLET | ORAL | Status: DC | PRN
Start: 2014-01-21 — End: 2014-01-23

## 2014-01-21 MED ORDER — OXYCODONE-ACETAMINOPHEN 5-325 MG PO TABS
1.0000 | ORAL_TABLET | ORAL | Status: DC | PRN
  Administered 2014-01-21: 1 via ORAL
  Administered 2014-01-22: 2 via ORAL
  Administered 2014-01-22 – 2014-01-23 (×5): 1 via ORAL
  Filled 2014-01-21: qty 2
  Filled 2014-01-21 (×6): qty 1

## 2014-01-21 MED ORDER — LACTATED RINGERS IV SOLN
INTRAVENOUS | Status: DC
  Administered 2014-01-21 (×2): via INTRAVENOUS

## 2014-01-21 MED ORDER — NALBUPHINE HCL 10 MG/ML IJ SOLN
5.0000 mg | INTRAMUSCULAR | Status: DC | PRN
  Administered 2014-01-21: 10 mg via SUBCUTANEOUS
  Filled 2014-01-21: qty 1

## 2014-01-21 MED ORDER — ONDANSETRON HCL 4 MG/2ML IJ SOLN: 4.0000 mg | Freq: Three times a day (TID) | INTRAMUSCULAR | Status: DC | PRN

## 2014-01-21 MED ORDER — BUPIVACAINE HCL (PF) 0.25 % IJ SOLN
INTRAMUSCULAR | Status: AC
  Filled 2014-01-21: qty 10

## 2014-01-21 MED ORDER — SODIUM CHLORIDE 0.9 % IJ SOLN: 3.0000 mL | INTRAMUSCULAR | Status: DC | PRN

## 2014-01-21 MED ORDER — ONDANSETRON HCL 4 MG/2ML IJ SOLN
INTRAMUSCULAR | Status: DC | PRN
  Administered 2014-01-21: 4 mg via INTRAVENOUS

## 2014-01-21 MED ORDER — SCOPOLAMINE 1 MG/3DAYS TD PT72
MEDICATED_PATCH | TRANSDERMAL | Status: AC
  Administered 2014-01-21: 1.5 mg via TRANSDERMAL
  Filled 2014-01-21: qty 1

## 2014-01-21 MED ORDER — DIPHENHYDRAMINE HCL 50 MG/ML IJ SOLN: 25.0000 mg | INTRAMUSCULAR | Status: DC | PRN

## 2014-01-21 MED ORDER — KETOROLAC TROMETHAMINE 30 MG/ML IJ SOLN
INTRAMUSCULAR | Status: AC
  Administered 2014-01-21: 30 mg via INTRAVENOUS
  Filled 2014-01-21: qty 1

## 2014-01-21 MED ORDER — CEFAZOLIN SODIUM-DEXTROSE 2-3 GM-% IV SOLR
INTRAVENOUS | Status: DC | PRN
  Administered 2014-01-21: 2 g via INTRAVENOUS

## 2014-01-21 MED ORDER — PHENYLEPHRINE 8 MG IN D5W 100 ML (0.08MG/ML) PREMIX OPTIME
INJECTION | INTRAVENOUS | Status: DC | PRN
  Administered 2014-01-21: 60 ug/min via INTRAVENOUS

## 2014-01-21 MED ORDER — KETOROLAC TROMETHAMINE 30 MG/ML IJ SOLN: 15.0000 mg | Freq: Once | INTRAMUSCULAR | Status: DC | PRN

## 2014-01-21 MED ORDER — DIPHENHYDRAMINE HCL 25 MG PO CAPS: 25.0000 mg | ORAL_CAPSULE | ORAL | Status: DC | PRN

## 2014-01-21 MED ORDER — ZOLPIDEM TARTRATE 5 MG PO TABS: 5.0000 mg | ORAL_TABLET | Freq: Every evening | ORAL | Status: DC | PRN

## 2014-01-21 MED ORDER — SIMETHICONE 80 MG PO CHEW
80.0000 mg | CHEWABLE_TABLET | ORAL | Status: DC
  Administered 2014-01-22 (×2): 80 mg via ORAL
  Filled 2014-01-21 (×2): qty 1

## 2014-01-21 MED ORDER — HYDROMORPHONE HCL PF 1 MG/ML IJ SOLN: 0.2500 mg | INTRAMUSCULAR | Status: DC | PRN

## 2014-01-21 MED ORDER — IBUPROFEN 600 MG PO TABS
600.0000 mg | ORAL_TABLET | Freq: Four times a day (QID) | ORAL | Status: DC
  Administered 2014-01-21 – 2014-01-23 (×8): 600 mg via ORAL
  Filled 2014-01-21 (×9): qty 1

## 2014-01-21 MED ORDER — IBUPROFEN 600 MG PO TABS
600.0000 mg | ORAL_TABLET | Freq: Four times a day (QID) | ORAL | Status: DC | PRN
  Administered 2014-01-21: 600 mg via ORAL

## 2014-01-21 MED ORDER — LACTATED RINGERS IV SOLN
INTRAVENOUS | Status: DC | PRN
  Administered 2014-01-20: via INTRAVENOUS

## 2014-01-21 MED ORDER — BISACODYL 10 MG RE SUPP: 10.0000 mg | Freq: Every day | RECTAL | Status: DC | PRN

## 2014-01-21 MED ORDER — BUPIVACAINE HCL (PF) 0.25 % IJ SOLN
INTRAMUSCULAR | Status: AC
  Filled 2014-01-21: qty 20

## 2014-01-21 MED ORDER — DIBUCAINE 1 % RE OINT: 1.0000 "application " | TOPICAL_OINTMENT | RECTAL | Status: DC | PRN

## 2014-01-21 MED ORDER — NALOXONE HCL 1 MG/ML IJ SOLN
1.0000 ug/kg/h | INTRAVENOUS | Status: DC | PRN
  Filled 2014-01-21: qty 2

## 2014-01-21 MED ORDER — KETOROLAC TROMETHAMINE 30 MG/ML IJ SOLN: 30.0000 mg | Freq: Four times a day (QID) | INTRAMUSCULAR | Status: AC | PRN

## 2014-01-21 MED ORDER — WITCH HAZEL-GLYCERIN EX PADS: 1.0000 "application " | MEDICATED_PAD | CUTANEOUS | Status: DC | PRN

## 2014-01-21 MED ORDER — BUPIVACAINE IN DEXTROSE 0.75-8.25 % IT SOLN
INTRATHECAL | Status: DC | PRN
  Administered 2014-01-21: 1.5 mL via INTRATHECAL

## 2014-01-21 MED ORDER — PRENATAL MULTIVITAMIN CH
1.0000 | ORAL_TABLET | Freq: Every day | ORAL | Status: DC
  Administered 2014-01-22: 1 via ORAL
  Filled 2014-01-21: qty 1

## 2014-01-21 MED ORDER — MENTHOL 3 MG MT LOZG: 1.0000 | LOZENGE | OROMUCOSAL | Status: DC | PRN

## 2014-01-21 MED ORDER — MEPERIDINE HCL 25 MG/ML IJ SOLN: 6.2500 mg | INTRAMUSCULAR | Status: DC | PRN

## 2014-01-21 MED ORDER — NALOXONE HCL 0.4 MG/ML IJ SOLN: 0.4000 mg | INTRAMUSCULAR | Status: DC | PRN

## 2014-01-21 MED ORDER — TETANUS-DIPHTH-ACELL PERTUSSIS 5-2.5-18.5 LF-MCG/0.5 IM SUSP: 0.5000 mL | Freq: Once | INTRAMUSCULAR | Status: DC

## 2014-01-21 MED ORDER — SENNOSIDES-DOCUSATE SODIUM 8.6-50 MG PO TABS
2.0000 | ORAL_TABLET | ORAL | Status: DC
  Administered 2014-01-22 (×2): 2 via ORAL
  Filled 2014-01-21 (×2): qty 2

## 2014-01-21 MED ORDER — MEPERIDINE HCL 25 MG/ML IJ SOLN
INTRAMUSCULAR | Status: AC
  Administered 2014-01-21: 12.5 mg via INTRAVENOUS
  Filled 2014-01-21: qty 1

## 2014-01-21 MED ORDER — LACTATED RINGERS IV SOLN
40.0000 [IU] | INTRAVENOUS | Status: DC | PRN
  Administered 2014-01-21: 40 [IU] via INTRAVENOUS

## 2014-01-21 MED ORDER — PROMETHAZINE HCL 25 MG/ML IJ SOLN: 6.2500 mg | INTRAMUSCULAR | Status: DC | PRN

## 2014-01-21 MED ORDER — DIPHENHYDRAMINE HCL 50 MG/ML IJ SOLN: 12.5000 mg | INTRAMUSCULAR | Status: DC | PRN

## 2014-01-21 MED ORDER — METOCLOPRAMIDE HCL 5 MG/ML IJ SOLN: 10.0000 mg | Freq: Three times a day (TID) | INTRAMUSCULAR | Status: DC | PRN

## 2014-01-21 MED ORDER — ONDANSETRON HCL 4 MG/2ML IJ SOLN: 4.0000 mg | INTRAMUSCULAR | Status: DC | PRN

## 2014-01-21 MED ORDER — MEPERIDINE HCL 25 MG/ML IJ SOLN
6.2500 mg | INTRAMUSCULAR | Status: DC | PRN
  Administered 2014-01-21 (×2): 12.5 mg via INTRAVENOUS

## 2014-01-21 MED ORDER — FENTANYL CITRATE 0.05 MG/ML IJ SOLN
INTRAMUSCULAR | Status: DC | PRN
  Administered 2014-01-21: 12.5 ug via INTRATHECAL

## 2014-01-21 MED ORDER — SIMETHICONE 80 MG PO CHEW: 80.0000 mg | CHEWABLE_TABLET | ORAL | Status: DC | PRN

## 2014-01-21 MED ORDER — NALBUPHINE HCL 10 MG/ML IJ SOLN
5.0000 mg | INTRAMUSCULAR | Status: DC | PRN
  Administered 2014-01-21 (×2): 10 mg via INTRAVENOUS
  Filled 2014-01-21 (×3): qty 1

## 2014-01-21 MED ORDER — SIMETHICONE 80 MG PO CHEW
80.0000 mg | CHEWABLE_TABLET | Freq: Three times a day (TID) | ORAL | Status: DC
  Administered 2014-01-21 – 2014-01-22 (×8): 80 mg via ORAL
  Filled 2014-01-21 (×8): qty 1

## 2014-01-21 MED ORDER — LANOLIN HYDROUS EX OINT: 1.0000 "application " | TOPICAL_OINTMENT | CUTANEOUS | Status: DC | PRN

## 2014-01-21 MED ORDER — DIPHENHYDRAMINE HCL 25 MG PO CAPS
25.0000 mg | ORAL_CAPSULE | Freq: Four times a day (QID) | ORAL | Status: DC | PRN
  Administered 2014-01-22: 25 mg via ORAL
  Filled 2014-01-21: qty 1

## 2014-01-21 MED ORDER — KETOROLAC TROMETHAMINE 30 MG/ML IJ SOLN
30.0000 mg | Freq: Four times a day (QID) | INTRAMUSCULAR | Status: AC | PRN
  Administered 2014-01-21: 30 mg via INTRAVENOUS

## 2014-01-21 MED ORDER — OXYTOCIN 40 UNITS IN LACTATED RINGERS INFUSION - SIMPLE MED: 62.5000 mL/h | INTRAVENOUS | Status: AC

## 2014-01-21 MED ORDER — FLEET ENEMA 7-19 GM/118ML RE ENEM: 1.0000 | ENEMA | Freq: Every day | RECTAL | Status: DC | PRN

## 2014-01-21 MED ORDER — MORPHINE SULFATE (PF) 0.5 MG/ML IJ SOLN
INTRAMUSCULAR | Status: DC | PRN
  Administered 2014-01-21: .2 mg via INTRATHECAL

## 2014-01-21 NOTE — Progress Notes (Signed)
UR completed 

## 2014-01-21 NOTE — Anesthesia Procedure Notes (Signed)
Spinal  Patient location during procedure: OR Start time: 01/21/2014 12:01 AM End time: 01/21/2014 12:04 AM Staffing Anesthesiologist: Lyn Hollingshead Performed by: anesthesiologist  Preanesthetic Checklist Completed: patient identified, surgical consent, pre-op evaluation, timeout performed, IV checked, risks and benefits discussed and monitors and equipment checked Spinal Block Patient position: sitting Prep: DuraPrep Patient monitoring: heart rate, cardiac monitor, continuous pulse ox and blood pressure Approach: midline Location: L3-4 Injection technique: single-shot Needle Needle type: Sprotte  Needle gauge: 24 G Needle length: 9 cm Needle insertion depth: 6 cm Assessment Sensory level: T4

## 2014-01-21 NOTE — Op Note (Signed)
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 Hulan Fray  ASSISTANT:  Dr. Leslie Andrea  INDICATIONS: Jamie [name] is a 28 y.o. [phone] at [redacted]w[redacted]d here for cesarean section secondary to the indications listed under preoperative diagnoses; please see preoperative note for further details.  The risks of cesarean section were discussed with the patient including but were not limited to: bleeding which may require transfusion or reoperation; infection which may require antibiotics; injury to bowel, bladder, ureters or other surrounding organs; injury to the fetus; need for additional procedures including hysterectomy in the event of a life-threatening hemorrhage; placental abnormalities wth subsequent pregnancies, incisional problems, thromboembolic phenomenon and other postoperative/anesthesia complications.   The patient concurred with the proposed plan, giving informed written consent for the procedure.    FINDINGS:  Viable female infant in cephalic presentation.  Apgars 8 and 9.  Clear amniotic fluid.  Intact placenta, three vessel cord.  Normal uterus, fallopian tubes and ovaries bilaterally.  ANESTHESIA: Spinal INTRAVENOUS FLUIDS: 700 ml ESTIMATED BLOOD LOSS: 600 ml URINE OUTPUT:  300 ml SPECIMENS: Placenta sent to L&D, right ovary to pathology COMPLICATIONS: None immediate  PROCEDURE IN DETAIL:  The patient preoperatively received intravenous antibiotics and had sequential compression devices applied to her lower extremities.  She was then taken to the operating room where spinal anesthesia was administered and was found to be adequate. She was then placed in a dorsal supine position with a leftward tilt, and prepped and draped in a  sterile manner.  A foley catheter was placed into her bladder and attached to constant gravity.  After an adequate timeout was performed, 30 ml of 0.25% bupivicane was injected subcutaneously around the incision, and a Pfannenstiel skin incision was made through prior scar with scalpel and carried through to the underlying layer of fascia. The fascia was incised in the midline, and this incision was extended bilaterally using the Mayo scissors.  The rectus muscles was transected medial third with bovie then separated in the midline bluntly and the peritoneum was entered bluntly. Attention was turned to the lower uterine segment where a low transverse hysterotomy was made with a scalpel and extended bilaterally bluntly.  The infant was successfully delivered, the cord was clamped and cut and the infant was handed over to awaiting neonatology team. Uterine massage was then administered, and the placenta delivered intact with a three-vessel cord. The uterus was then cleared of clot and debris. The uterus was exteriorized.  The hysterotomy was closed with 0 Vicryl in a running locked fashion. 2 figure of 8's were used for hemostatsis. The pelvis was cleared of all clot and debris. Hemostasis was confirmed on all surfaces.    Attention was then turned to the right ovary. Obvious cyst identified and a portion of the broad ligament and ovarian ligament were clamped and amputated. 2.0 vicryl sutures used to ensure hemostasis. Uterus was replaced in abdomen, and hemostasis was confirmed again.  The peritoneum was reapproximated using 0 Vicryl interrupted stitches. The fascia was then closed using looped PDS in a running fashion.  The subcutaneous layer was irrigated with a sloppy wet. The skin was closed with a 4-0 Vicryl subcuticular stitch. The patient tolerated the procedure well. Sponge, lap, instrument and needle counts were correct x 2.  She was taken to  the recovery room in stable condition.   Jamie Rigger,  MD OB Fellow

## 2014-01-21 NOTE — Anesthesia Postprocedure Evaluation (Signed)
Anesthesia Post Note  Patient: Jamie [name]  Procedure(s) Performed: Procedure(s) (LRB): CESAREAN SECTION (N/A) OOPHORECTOMY (Right)  Anesthesia type: Spinal  Patient location: PACU  Post pain: Pain level controlled  Post assessment: Post-op Vital signs reviewed  Last Vitals:  Filed Vitals:   01/21/14 0245  BP: 126/76  Pulse: 97  Temp:   Resp: 15    Post vital signs: Reviewed  Level of consciousness: awake  Complications: No apparent anesthesia complications

## 2014-01-21 NOTE — Transfer of Care (Signed)
Immediate Anesthesia Transfer of Care Note  Patient: Jamie [name]  Procedure(s) Performed: Procedure(s): CESAREAN SECTION (N/A)  Patient Location: PACU  Anesthesia Type:Spinal  Level of Consciousness: awake, alert  and oriented  Airway & Oxygen Therapy: Patient Spontanous Breathing  Post-op Assessment: Report given to PACU RN and Post -op Vital signs reviewed and stable  Post vital signs: stable  Complications: No apparent anesthesia complications

## 2014-01-21 NOTE — Anesthesia Postprocedure Evaluation (Signed)
  Anesthesia Post-op Note Anesthesia Post Note  Patient: Jamie [name]  Procedure(s) Performed: Procedure(s) (LRB): CESAREAN SECTION (N/A) OOPHORECTOMY (Right)  Anesthesia type: Spinal  Patient location: Mother/Baby  Post pain: Pain level controlled  Post assessment: Post-op Vital signs reviewed  Last Vitals:  Filed Vitals:   01/21/14 0630  BP: 125/74  Pulse: 93  Temp: 37.5 C  Resp: 16    Post vital signs: Reviewed  Level of consciousness: awake  Complications: No apparent anesthesia complications

## 2014-01-22 ENCOUNTER — Encounter (HOSPITAL_COMMUNITY): Payer: Self-pay | Admitting: Obstetrics & Gynecology

## 2014-01-22 NOTE — Progress Notes (Signed)
Subjective: Postpartum Day 1: Cesarean Delivery Jamie [name] is doing well overall.  Bleeding is improving.  Pain is much better today. Ambulating well and tolerating PO. + Flatus. Choosing Nexplanon for MOC. Plans to bottle-feed.  Baby boy will be circumcised iout-patient.    Objective: Vital signs in last 24 hours: Temp:  [97.4 F (36.3 C)-100.5 F (38.1 C)] 97.4 F (36.3 C) (01/28 0721) Pulse Rate:  [88-112] 88 (01/28 0721) Resp:  [16-20] 18 (01/28 0721) BP: (112-125)/(74-81) 124/80 mmHg (01/28 0721) SpO2:  [95 %-98 %] 98 % (01/28 0721)  Physical Exam:  General: alert, cooperative and no distress Lochia: appropriate Uterine Fundus: firm Incision: healing well, no significant drainage, no dehiscence, no significant erythema DVT Evaluation: No evidence of DVT seen on physical exam.   Recent Labs  01/20/14 2250 01/21/14 0610  HGB 13.1 11.3*  HCT 37.2 33.1*    Assessment/Plan: Status post Cesarean section. Doing well postoperatively.  Continue current care.  Discharge planning for tomorrow.  TUCKER, BRITTON L 01/22/2014, 7:53 AM  I have seen and examined this patient and agree with above documentation in the PA student's note. Pt doing well. Incision well appearing under honeycomb. D/c tomorrow.   Jamie [name], M.D. [facility] Fellow 01/22/2014 10:21 AM

## 2014-01-23 ENCOUNTER — Encounter: Payer: Medicaid Other | Admitting: Advanced Practice Midwife

## 2014-01-23 MED ORDER — IBUPROFEN 600 MG PO TABS: 600.0000 mg | ORAL_TABLET | Freq: Four times a day (QID) | ORAL | Status: DC

## 2014-01-23 MED ORDER — OXYCODONE-ACETAMINOPHEN 5-325 MG PO TABS: 1.0000 | ORAL_TABLET | ORAL | Status: DC | PRN

## 2014-01-23 NOTE — Discharge Instructions (Signed)
Postpartum Care After Cesarean Delivery °After you deliver your newborn (postpartum period), the usual stay in the hospital is 24 72 hours. If there were problems with your labor or delivery, or if you have other medical problems, you might be in the hospital longer.  °While you are in the hospital, you will receive help and instructions on how to care for yourself and your newborn during the postpartum period.  °While you are in the hospital: °· It is normal for you to have pain or discomfort from the incision in your abdomen. Be sure to tell your nurses when you are having pain, where the pain is located, and what makes the pain worse. °· If you are breastfeeding, you may feel uncomfortable contractions of your uterus for a couple of weeks. This is normal. The contractions help your uterus get back to normal size. °· It is normal to have some bleeding after delivery. °· For the first 1 3 days after delivery, the flow is red and the amount may be similar to a period. °· It is common for the flow to start and stop. °· In the first few days, you may pass some small clots. Let your nurses know if you begin to pass large clots or your flow increases. °· Do not  flush blood clots down the toilet before having the nurse look at them. °· During the next 3 10 days after delivery, your flow should become more watery and pink or brown-tinged in color. °· Ten to fourteen days after delivery, your flow should be a small amount of yellowish-white discharge. °· The amount of your flow will decrease over the first few weeks after delivery. Your flow may stop in 6 8 weeks. Most women have had their flow stop by 12 weeks after delivery. °· You should change your sanitary pads frequently. °· Wash your hands thoroughly with soap and water for at least 20 seconds after changing pads, using the toilet, or before holding or feeding your newborn. °· Your intravenous (IV) tubing will be removed when you are drinking enough fluids. °· The  urine drainage tube (urinary catheter) that was inserted before delivery may be removed within 6 8 hours after delivery or when feeling returns to your legs. You should feel like you need to empty your bladder within the first 6 8 hours after the catheter has been removed. °· In case you become weak, lightheaded, or faint, call your nurse before you get out of bed for the first time and before you take a shower for the first time. °· Within the first few days after delivery, your breasts may begin to feel tender and full. This is called engorgement. Breast tenderness usually goes away within 48 72 hours after engorgement occurs. You may also notice milk leaking from your breasts. If you are not breastfeeding, do not stimulate your breasts. Breast stimulation can make your breasts produce more milk. °· Spending as much time as possible with your newborn is very important. During this time, you and your newborn can feel close and get to know each other. Having your newborn stay in your room (rooming in) will help to strengthen the bond with your newborn. It will give you time to get to know your newborn and become comfortable caring for your newborn. °· Your hormones change after delivery. Sometimes the hormone changes can temporarily cause you to feel sad or tearful. These feelings should not last more than a few days. If these feelings last longer   than that, you should talk to your caregiver. °· If desired, talk to your caregiver about methods of family planning or contraception. °· Talk to your caregiver about immunizations. Your caregiver may want you to have the following immunizations before leaving the hospital: °· Tetanus, diphtheria, and pertussis (Tdap) or tetanus and diphtheria (Td) immunization. It is very important that you and your family (including grandparents) or others caring for your newborn are up-to-date with the Tdap or Td immunizations. The Tdap or Td immunization can help protect your newborn  from getting ill. °· Rubella immunization. °· Varicella (chickenpox) immunization. °· Influenza immunization. You should receive this annual immunization if you did not receive the immunization during your pregnancy. °Document Released: 09/05/2012 Document Reviewed: 09/05/2012 °ExitCare® Patient Information ©2014 [employer]. ° °

## 2014-01-23 NOTE — Clinical Social Work Maternal (Signed)
Clinical Social Work Department PSYCHOSOCIAL ASSESSMENT - MATERNAL/CHILD 01/23/2014  Patient:  Jamie [name]  Account Number:  [id number]  Admit Date:  01/20/2014  Childs Name:   Jamie [name]    Clinical Social Worker:   , LCSW   Date/Time:  01/21/2014 01:11 PM  Date Referred:  01/21/2014   Referral source  CN     Referred reason  Substance Abuse   Other referral source:    I:  FAMILY / HOME ENVIRONMENT Child's legal guardian:  PARENT  Guardian - Name Guardian - Age Guardian - Address  Jamie [name] 28 [name] [place], Jamie [name]  Jamie [name] 24    Other household support members/support persons Name Relationship DOB  Jamie [name] DAUGHTER 4 years old  Jamie [name] SON 10 years old   Other support:   Jamie [name] & Jamie [name]- pts parents    II  PSYCHOSOCIAL DATA Information Source:  Patient Interview  Financial and Community Resources Employment:   Jamie [name]   Financial resources:  Medicaid If Medicaid - County:  [place] Other  Food Stamps  WIC  Public Housing   School / Grade:   Maternity [name] Coordinator / Child Services Coordination / Early Interventions:   Yes  Cultural issues impacting [name]:    III  STRENGTHS Strengths  Adequate Resources  Home prepared for Child (including basic supplies)  Supportive family/friends   Strength comment:    IV  RISK FACTORS AND CURRENT PROBLEMS Current Problem:  YES   Risk Factor & Current Problem Patient Issue Family Issue Risk Factor / Current Problem Comment  Substance Abuse Y N Hx of MJ use   N N     V  SOCIAL WORK ASSESSMENT CSW met with pt to assess history of MJ use.  Pt is a 28 year old, G7P3 who lives alone with her children.  She is employed part-time as a CNA.  CSW inquired about MJ use & pt admitted to previous use.  Pt admits to smoking MJ "twice a week" prior to pregnancy confirmation at 8 weeks. Once pregnancy was confirmed,  she stopped smoking MJ immediately.  She denies other illegal substance use.   CSW explained hospital drug testing policy & pt verbalized an understanding.  UDS is negative, meconium results are pending.  Pt has all the necessary supplies for the infant & appears to be bonding well.  PP depression signs/symptoms discussed briefly.  Pt denies any history of depression or SI.  CSW will continue to monitor drug screen results & make a referral if needed.      VI SOCIAL WORK PLAN Social Work Plan  No Further Intervention Required / No Barriers to Discharge   Type of pt/family education:   If child protective services report - county:   If child protective services report - date:   Information/referral to community resources comment:   Other social work plan:      

## 2014-01-23 NOTE — Discharge Summary (Signed)
Obstetric Discharge Summary Reason for Admission: onset of labor Prenatal Procedures: none Intrapartum Procedures: cesarean: low cervical, transverse and oopherectomy for large benign cystic teratoma Postpartum Procedures: none Complications-Operative and Postpartum: none Hemoglobin  Date Value Range Status  01/21/2014 11.3* 12.0 - 15.0 g/dL Final     HCT  Date Value Range Status  01/21/2014 33.1* 36.0 - 46.0 % Final    Physical Exam:  General: alert, cooperative and no distress Lochia: appropriate Uterine Fundus: firm Incision: no significant drainage, no significant erythema DVT Evaluation: No evidence of DVT seen on physical exam. No cords or calf tenderness. No significant calf/ankle edema.  Discharge Diagnoses: Term Pregnancy-delivered  Discharge Information: Date: 01/23/2014 Activity: pelvic rest Diet: routine Medications: Ibuprofen and Percocet Condition: stable Instructions: refer to practice specific booklet Discharge to: home Contraception: planning for nexplanon Follow-up Information   Follow up with [facility]. Schedule an appointment as soon as possible for a visit in 4 weeks.   Contact information:   [place] [id number] [phone]     Newborn Data: Live born female  Birth Weight: 7 lb 3 oz (3260 g) APGAR: 8, 9  Home with mother.  Jamie [name] 01/23/2014, 7:49 AM  I reviewed the residents note and agree with resident's note and plan of care.  Fredrik Rigger, MD OB Fellow 01/28/2014 10:40 AM

## 2014-01-23 NOTE — Progress Notes (Signed)
Honeycomb dressing curled up at bottom and exposed steri strips noted.  Replaced honeycomb dressing.

## 2014-01-28 NOTE — Discharge Summary (Signed)
Attestation of Attending Supervision of Obstetric Fellow: Evaluation and management procedures were performed by the Obstetric Fellow under my supervision and collaboration.  I have reviewed the Obstetric Fellow's note and chart, and I agree with the management and plan.  Shulamit Donofrio, MD, FACOG Attending Obstetrician & Gynecologist Faculty Practice, [facility]   

## 2014-02-03 ENCOUNTER — Inpatient Hospital Stay (HOSPITAL_COMMUNITY): Admission: RE | Admit: 2014-02-03 | Payer: Medicaid Other | Source: Ambulatory Visit

## 2014-02-04 ENCOUNTER — Inpatient Hospital Stay (HOSPITAL_COMMUNITY)
Admission: RE | Admit: 2014-02-04 | Payer: Medicaid Other | Source: Ambulatory Visit | Admitting: Obstetrics & Gynecology

## 2014-02-04 ENCOUNTER — Encounter (HOSPITAL_COMMUNITY): Admission: RE | Payer: Self-pay | Source: Ambulatory Visit

## 2014-02-04 SURGERY — Surgical Case
Anesthesia: Regional

## 2014-02-06 ENCOUNTER — Encounter: Payer: Medicaid Other | Admitting: Obstetrics and Gynecology

## 2014-02-07 ENCOUNTER — Ambulatory Visit (INDEPENDENT_AMBULATORY_CARE_PROVIDER_SITE_OTHER): Payer: Medicaid Other | Admitting: Obstetrics and Gynecology

## 2014-02-07 ENCOUNTER — Encounter: Payer: Self-pay | Admitting: Obstetrics and Gynecology

## 2014-02-07 VITALS — BP 120/70 | Temp 98.7°F | Ht 61.0 in | Wt 169.0 lb

## 2014-02-07 DIAGNOSIS — Z9889 Other specified postprocedural states: Secondary | ICD-10-CM

## 2014-02-07 MED ORDER — HYDROCODONE-ACETAMINOPHEN 5-325 MG PO TABS: 1.0000 | ORAL_TABLET | Freq: Four times a day (QID) | ORAL | Status: DC | PRN

## 2014-02-07 NOTE — Patient Instructions (Signed)
Etonogestrel implant What is this medicine? ETONOGESTREL (et oh noe JES trel) is a contraceptive (birth control) device. It is used to prevent pregnancy. It can be used for up to 3 years. This medicine may be used for other purposes; ask your health care provider or pharmacist if you have questions. COMMON BRAND NAME(S): Implanon, Nexplanon  What should I tell my health care provider before I take this medicine? They need to know if you have any of these conditions: -abnormal vaginal bleeding -blood vessel disease or blood clots -cancer of the breast, cervix, or liver -depression -diabetes -gallbladder disease -headaches -heart disease or recent heart attack -high blood pressure -high cholesterol -kidney disease -liver disease -renal disease -seizures -tobacco smoker -an unusual or allergic reaction to etonogestrel, other hormones, anesthetics or antiseptics, medicines, foods, dyes, or preservatives -pregnant or trying to get pregnant -breast-feeding How should I use this medicine? This device is inserted just under the skin on the inner side of your upper arm by a health care professional. Talk to your pediatrician regarding the use of this medicine in children. Special care may be needed. Overdosage: If you think you've taken too much of this medicine contact a poison control center or emergency room at once. Overdosage: If you think you have taken too much of this medicine contact a poison control center or emergency room at once. NOTE: This medicine is only for you. Do not share this medicine with others. What if I miss a dose? This does not apply. What may interact with this medicine? Do not take this medicine with any of the following medications: -amprenavir -bosentan -fosamprenavir This medicine may also interact with the following medications: -barbiturate medicines for inducing sleep or treating seizures -certain medicines for fungal infections like ketoconazole and  itraconazole -griseofulvin -medicines to treat seizures like carbamazepine, felbamate, oxcarbazepine, phenytoin, topiramate -modafinil -phenylbutazone -rifampin -some medicines to treat HIV infection like atazanavir, indinavir, lopinavir, nelfinavir, tipranavir, ritonavir -St. Danne Vasek's wort This list may not describe all possible interactions. Give your health care provider a list of all the medicines, herbs, non-prescription drugs, or dietary supplements you use. Also tell them if you smoke, drink alcohol, or use illegal drugs. Some items may interact with your medicine. What should I watch for while using this medicine? This product does not protect you against HIV infection (AIDS) or other sexually transmitted diseases. You should be able to feel the implant by pressing your fingertips over the skin where it was inserted. Tell your doctor if you cannot feel the implant. What side effects may I notice from receiving this medicine? Side effects that you should report to your doctor or health care professional as soon as possible: -allergic reactions like skin rash, itching or hives, swelling of the face, lips, or tongue -breast lumps -changes in vision -confusion, trouble speaking or understanding -dark urine -depressed mood -general ill feeling or flu-like symptoms -light-colored stools -loss of appetite, nausea -right upper belly pain -severe headaches -severe pain, swelling, or tenderness in the abdomen -shortness of breath, chest pain, swelling in a leg -signs of pregnancy -sudden numbness or weakness of the face, arm or leg -trouble walking, dizziness, loss of balance or coordination -unusual vaginal bleeding, discharge -unusually weak or tired -yellowing of the eyes or skin Side effects that usually do not require medical attention (Report these to your doctor or health care professional if they continue or are bothersome.): -acne -breast pain -changes in  weight -cough -fever or chills -headache -irregular menstrual bleeding -itching, burning,   and vaginal discharge -pain or difficulty passing urine -sore throat This list may not describe all possible side effects. Call your doctor for medical advice about side effects. You may report side effects to FDA at 1-800-FDA-1088. Where should I keep my medicine? This drug is given in a hospital or clinic and will not be stored at home. NOTE: This sheet is a summary. It may not cover all possible information. If you have questions about this medicine, talk to your doctor, pharmacist, or health care provider.  2014, Elsevier/Gold Standard. (2012-06-18 15:37:45)  

## 2014-02-07 NOTE — Progress Notes (Signed)
This chart was scribed by Jenne Campus, Medical Scribe, for Dr. [name] on 02/07/14 at 1:01 PM. This chart was reviewed by Dr. [name] and is accurate.   Subjective:  Jamie [name] is a 28 y.o. female who presents to the clinic 2.5 weeks status post right oophorectomy and c-section.    Review of Systems Negative except having pain on the right side and fever x4 days ago, fever since resolved  She has been eating a regular diet without difficulty.   Bowel movements are normal. Pain is controlled with current analgesics. Medications being used: Tylenol 3, but ran out recently.  Objective:  BP 120/70  Temp(Src) 98.7 F (37.1 C)  Ht 5\' 1"  (1.549 m)  Wt 169 lb (76.658 kg)  BMI 31.95 kg/m2  Breastfeeding? No Chaperone present for exam. Exam performed with pt's permission with no severe discomfort or complications. General:Well developed, well nourished.  No acute distress. Abdomen: Bowel sounds normal, soft, non-tender.  Incision(s):   Healing well, no drainage, no erythema, no hernia, no swelling, no dehiscence, incision well approximated.   Assessment:  Post-Op 2.5 weeks s/p right oophorectomy and c-section    Doing well postoperatively.   Plan:  1.  Wound care discussed  2.  Continue any current medications. D/c  Percocet, move down to Norco due to persistent pain in area of oophorectomy 3.  Activity restrictions: none 4.  return to work: not applicable. 5.  Follow up in 4 weeks for nexplanon implant.

## 2014-02-25 ENCOUNTER — Ambulatory Visit: Payer: Medicaid Other | Admitting: Advanced Practice Midwife

## 2014-02-28 ENCOUNTER — Ambulatory Visit: Payer: Medicaid Other | Admitting: Adult Health

## 2014-03-04 ENCOUNTER — Ambulatory Visit: Payer: Medicaid Other | Admitting: Adult Health

## 2014-03-07 ENCOUNTER — Encounter: Payer: Medicaid Other | Admitting: Obstetrics and Gynecology

## 2014-03-07 ENCOUNTER — Ambulatory Visit (INDEPENDENT_AMBULATORY_CARE_PROVIDER_SITE_OTHER): Payer: Medicaid Other | Admitting: Obstetrics and Gynecology

## 2014-03-07 ENCOUNTER — Encounter: Payer: Self-pay | Admitting: Obstetrics and Gynecology

## 2014-03-07 NOTE — Progress Notes (Signed)
This chart was scribed by Jenne Campus, Medical Scribe, for Dr. [name] on 3/13/15at 10:18 AM. This chart was reviewed by Dr. [name] and is accurate.   Subjective:  Jamie [name] is a 28 y.o. female who presents for a 6 weeks postpartum visit  Patient concerns: ongoing pain from c-section scar. Tugging pain under navel which has been gradually improving Prenatal and intrapartum course notable for teratoma of right ovary. Marijuana abuse   Patient is sexually active. Using condoms  The following portions of the patient's history were reviewed and updated as appropriate: allergies, current medications, past family history, past medical history, past surgical history and problem list.  Review of Systems    See Subjective, otherwise negative ROS.  Objective:  BP 110/76  Ht 5\' 1"  (1.549 m)  Wt 171 lb (77.565 kg)  BMI 32.33 kg/m2  LMP 02/23/2014  Breastfeeding? No  Chaperone present for exam which was done with pt's permission General:  alert, cooperative and no distress     Lungs: clear to auscultation bilaterally  Heart:  regular rate and rhythm, S1, S2 normal, no murmur  Abdomen: soft, non-tender; bowel sounds normal; no masses,  no organomegaly   Vulva:  normal  Vagina: normal vagina  Cervix:  normal  Corpus: normal size, contour, position, consistency, mobility, non-tender  Adnexa:  normal adnexa          Assessment:  1.  postpartum exam.  2. Contraception: Nexplanon ASAP 3. ASSYMPTOMATIC RIGHT OVARIAN TERATOMA  Plan:  F/u in 3 months s/p c-section for re-eval  pain Nexplanon ASAP

## 2014-03-17 ENCOUNTER — Encounter: Payer: Medicaid Other | Admitting: Women's Health

## 2014-03-19 ENCOUNTER — Encounter: Payer: Self-pay | Admitting: Women's Health

## 2014-03-19 ENCOUNTER — Ambulatory Visit (INDEPENDENT_AMBULATORY_CARE_PROVIDER_SITE_OTHER): Payer: Medicaid Other | Admitting: Women's Health

## 2014-03-19 VITALS — BP 120/78 | Ht 61.0 in | Wt 168.0 lb

## 2014-03-19 DIAGNOSIS — N760 Acute vaginitis: Secondary | ICD-10-CM

## 2014-03-19 DIAGNOSIS — B9689 Other specified bacterial agents as the cause of diseases classified elsewhere: Secondary | ICD-10-CM

## 2014-03-19 DIAGNOSIS — N898 Other specified noninflammatory disorders of vagina: Secondary | ICD-10-CM

## 2014-03-19 DIAGNOSIS — R109 Unspecified abdominal pain: Secondary | ICD-10-CM

## 2014-03-19 DIAGNOSIS — Z30017 Encounter for initial prescription of implantable subdermal contraceptive: Secondary | ICD-10-CM

## 2014-03-19 DIAGNOSIS — A499 Bacterial infection, unspecified: Secondary | ICD-10-CM

## 2014-03-19 DIAGNOSIS — Z3202 Encounter for pregnancy test, result negative: Secondary | ICD-10-CM

## 2014-03-19 LAB — POCT URINALYSIS DIPSTICK
GLUCOSE UA: NEGATIVE
Ketones, UA: NEGATIVE
Leukocytes, UA: NEGATIVE
NITRITE UA: NEGATIVE
Protein, UA: NEGATIVE
RBC UA: NEGATIVE

## 2014-03-19 LAB — POCT WET PREP (WET MOUNT): Clue Cells Wet Prep Whiff POC: POSITIVE

## 2014-03-19 LAB — POCT URINE PREGNANCY: PREG TEST UR: NEGATIVE

## 2014-03-19 MED ORDER — METRONIDAZOLE 500 MG PO TABS: 500.0000 mg | ORAL_TABLET | Freq: Two times a day (BID) | ORAL | Status: DC

## 2014-03-19 NOTE — Progress Notes (Signed)
Patient ID: Jamie [name], female   DOB: 26-Aug-1986, 28 y.o.   MRN: [id number] Jamie [name] is a 28 y.o. year old African American female here for Nexplanon insertion 55months s/p RLTCS.  She also reports malodorous vag d/c and abd cramping. Patient's last menstrual period was 02/23/2014., last sexual intercourse was >2wks ago, and her pregnancy test today was negative.  Risks/benefits/side effects of Nexplanon have been discussed and her questions have been answered.  Specifically, a failure rate of 12/998 has been reported, with an increased failure rate if pt takes Sanborn and/or antiseizure medicaitons.  Jamie [name] is aware of the common side effect of irregular bleeding, which the incidence of decreases over time.  BP 120/78  Ht 5\' 1"  (1.549 m)  Wt 168 lb (76.204 kg)  BMI 31.76 kg/m2  LMP 02/23/2014  Breastfeeding? No  Results for orders placed in visit on 03/19/14 (from the past 24 hour(s))  POCT URINE PREGNANCY   Collection Time    03/19/14  3:31 PM      Result Value Ref Range   Preg Test, Ur Negative    POCT URINALYSIS DIPSTICK   Collection Time    03/19/14  3:32 PM      Result Value Ref Range   Color, UA       Clarity, UA       Glucose, UA neg     Bilirubin, UA       Ketones, UA neg     Spec Grav, UA       Blood, UA neg     pH, UA       Protein, UA neg     Urobilinogen, UA       Nitrite, UA neg     Leukocytes, UA Negative    POCT WET PREP (WET MOUNT)   Collection Time    03/19/14  4:03 PM      Result Value Ref Range   Source Wet Prep POC vaginal     WBC, Wet Prep HPF POC few     Bacteria Wet Prep HPF POC none     BACTERIA WET PREP MORPHOLOGY POC       Clue Cells Wet Prep HPF POC Many     CLUE CELLS WET PREP WHIFF POC Positive Whiff     Yeast Wet Prep HPF POC None     KOH Wet Prep POC       Trichomonas Wet Prep HPF POC none       She is right-handed, so her left arm, approximately 4 inches proximal from the elbow, was cleansed with  alcohol and anesthetized with 2cc of 2% Lidocaine.  The area was cleansed again with betadine and the Nexplanon was inserted per manufacturer's recommendations without difficulty.  A steri-strip and pressure bandage were applied.  Spec exam: small amt thin white malodorous d/c, wet prep: +clues, few wbc's  Pt was instructed to keep the area clean and dry, remove pressure bandage in 24 hours, and keep insertion site covered with the steri-strip for 3-5 days.  Back up contraception was recommended for 2 weeks.  She was given a card indicating date Nexplanon was inserted and date it needs to be removed.   Rx metronidazole 500mg  bid x 7d, no etoh! F/U 45mth for pap & physical   Tawnya Crook CNM, [facility] 03/19/2014 4:06 PM

## 2014-03-19 NOTE — Patient Instructions (Signed)
Keep the area clean and dry.  You can remove the big bandage in 24 hours, and the small steri-strip bandage in 3-5 days.  A back up method, such as condoms, should be used for two weeks. You may have irregular vaginal bleeding for the first 6 months after the Nexplanon is placed, then the bleeding usually lightens and it is possible that you may not have any periods.  If you have any concerns, please give [place] a call.    Etonogestrel implant- nexplanon What is this medicine? ETONOGESTREL (et oh noe JES trel) is a contraceptive (birth control) device. It is used to prevent pregnancy. It can be used for up to 3 years. This medicine may be used for other purposes; ask your health care provider or pharmacist if you have questions. COMMON BRAND NAME(S): Implanon, Nexplanon  What should I tell my health care provider before I take this medicine? They need to know if you have any of these conditions: -abnormal vaginal bleeding -blood vessel disease or blood clots -cancer of the breast, cervix, or liver -depression -diabetes -gallbladder disease -headaches -heart disease or recent heart attack -high blood pressure -high cholesterol -kidney disease -liver disease -renal disease -seizures -tobacco smoker -an unusual or allergic reaction to etonogestrel, other hormones, anesthetics or antiseptics, medicines, foods, dyes, or preservatives -pregnant or trying to get pregnant -breast-feeding How should I use this medicine? This device is inserted just under the skin on the inner side of your upper arm by a health care professional. Talk to your pediatrician regarding the use of this medicine in children. Special care may be needed. Overdosage: If you think you've taken too much of this medicine contact a poison control center or emergency room at once. Overdosage: If you think you have taken too much of this medicine contact a poison control center or emergency room at once. NOTE: This medicine is  only for you. Do not share this medicine with others. What if I miss a dose? This does not apply. What may interact with this medicine? Do not take this medicine with any of the following medications: -amprenavir -bosentan -fosamprenavir This medicine may also interact with the following medications: -barbiturate medicines for inducing sleep or treating seizures -certain medicines for fungal infections like ketoconazole and itraconazole -griseofulvin -medicines to treat seizures like carbamazepine, felbamate, oxcarbazepine, phenytoin, topiramate -modafinil -phenylbutazone -rifampin -some medicines to treat HIV infection like atazanavir, indinavir, lopinavir, nelfinavir, tipranavir, ritonavir -St. John's wort This list may not describe all possible interactions. Give your health care provider a list of all the medicines, herbs, non-prescription drugs, or dietary supplements you use. Also tell them if you smoke, drink alcohol, or use illegal drugs. Some items may interact with your medicine. What should I watch for while using this medicine? This product does not protect you against HIV infection (AIDS) or other sexually transmitted diseases. You should be able to feel the implant by pressing your fingertips over the skin where it was inserted. Tell your doctor if you cannot feel the implant. What side effects may I notice from receiving this medicine? Side effects that you should report to your doctor or health care professional as soon as possible: -allergic reactions like skin rash, itching or hives, swelling of the face, lips, or tongue -breast lumps -changes in vision -confusion, trouble speaking or understanding -dark urine -depressed mood -general ill feeling or flu-like symptoms -light-colored stools -loss of appetite, nausea -right upper belly pain -severe headaches -severe pain, swelling, or tenderness in the abdomen -  shortness of breath, chest pain, swelling in a  leg -signs of pregnancy -sudden numbness or weakness of the face, arm or leg -trouble walking, dizziness, loss of balance or coordination -unusual vaginal bleeding, discharge -unusually weak or tired -yellowing of the eyes or skin Side effects that usually do not require medical attention (Report these to your doctor or health care professional if they continue or are bothersome.): -acne -breast pain -changes in weight -cough -fever or chills -headache -irregular menstrual bleeding -itching, burning, and vaginal discharge -pain or difficulty passing urine -sore throat This list may not describe all possible side effects. Call your doctor for medical advice about side effects. You may report side effects to FDA at 1-800-FDA-1088. Where should I keep my medicine? This drug is given in a hospital or clinic and will not be stored at home. NOTE: This sheet is a summary. It may not cover all possible information. If you have questions about this medicine, talk to your doctor, pharmacist, or health care provider.  2014, Elsevier/Gold Standard. (2012-06-18 15:37:45)

## 2014-03-20 LAB — GC/CHLAMYDIA PROBE AMP
CT Probe RNA: NEGATIVE
GC PROBE AMP APTIMA: NEGATIVE

## 2014-04-13 ENCOUNTER — Emergency Department (HOSPITAL_COMMUNITY)
Admission: EM | Admit: 2014-04-13 | Discharge: 2014-04-13 | Disposition: A | Discharge status: Home / Self Care | Payer: Medicaid Other | Attending: Emergency Medicine | Admitting: Emergency Medicine

## 2014-04-13 ENCOUNTER — Encounter (HOSPITAL_COMMUNITY): Payer: Self-pay | Admitting: Emergency Medicine

## 2014-04-13 ENCOUNTER — Emergency Department (HOSPITAL_COMMUNITY): Payer: Medicaid Other

## 2014-04-13 DIAGNOSIS — R053 Chronic cough: Secondary | ICD-10-CM

## 2014-04-13 DIAGNOSIS — R059 Cough, unspecified: Secondary | ICD-10-CM | POA: Insufficient documentation

## 2014-04-13 DIAGNOSIS — Z8619 Personal history of other infectious and parasitic diseases: Secondary | ICD-10-CM | POA: Insufficient documentation

## 2014-04-13 DIAGNOSIS — R05 Cough: Secondary | ICD-10-CM | POA: Insufficient documentation

## 2014-04-13 DIAGNOSIS — Z791 Long term (current) use of non-steroidal anti-inflammatories (NSAID): Secondary | ICD-10-CM | POA: Insufficient documentation

## 2014-04-13 DIAGNOSIS — R0789 Other chest pain: Secondary | ICD-10-CM | POA: Insufficient documentation

## 2014-04-13 DIAGNOSIS — F17200 Nicotine dependence, unspecified, uncomplicated: Secondary | ICD-10-CM | POA: Insufficient documentation

## 2014-04-13 DIAGNOSIS — Z79899 Other long term (current) drug therapy: Secondary | ICD-10-CM | POA: Insufficient documentation

## 2014-04-13 DIAGNOSIS — Z8742 Personal history of other diseases of the female genital tract: Secondary | ICD-10-CM | POA: Insufficient documentation

## 2014-04-13 DIAGNOSIS — Z87440 Personal history of urinary (tract) infections: Secondary | ICD-10-CM | POA: Insufficient documentation

## 2014-04-13 DIAGNOSIS — I10 Essential (primary) hypertension: Secondary | ICD-10-CM | POA: Insufficient documentation

## 2014-04-13 MED ORDER — BENZONATATE 100 MG PO CAPS
200.0000 mg | ORAL_CAPSULE | Freq: Once | ORAL | Status: AC
  Administered 2014-04-13: 200 mg via ORAL
  Filled 2014-04-13: qty 2

## 2014-04-13 MED ORDER — BENZONATATE 100 MG PO CAPS
200.0000 mg | ORAL_CAPSULE | Freq: Three times a day (TID) | ORAL | Status: DC | PRN
Start: 2014-04-13 — End: 2014-04-21

## 2014-04-13 NOTE — ED Provider Notes (Signed)
CSN: [id number]     Arrival date & time 04/13/14  1442 History   First MD Initiated Contact with Patient 04/13/14 1511     This chart was scribed for non-physician practitioner, Evalee Jefferson PA-C working with Babette Relic, MD by Forrestine Him, ED Scribe. This patient was seen in room APFT22/APFT22 and the patient's care was started at 3:34 PM.   Chief Complaint  Patient presents with  . Cough   The history is provided by the patient. No language interpreter was used.    HPI Comments: Jamie [name] is a 28 y.o. female who presents to the Emergency Department complaining of an ongoing, constant occasionally productive cough consisting of thick green sputum x 4 weeks that is progressively worsening. She also reports mild chest tenderness she attributes with constant coughing. Pt states her cough is worsened at night time and typically is intense enough to keep her from sleep. She has tried OTC Tylenol cold and Alkalizer cold with mild temporary improvement. At this time she denies any sore throat, eye itching, joint swelling, nausea, vomiting, abdominal pain, nasal congestion, wheezing, SOB, or otalgia.  She did have uri sx including nasal congestion, drainage and sore throat when the cough began which have resolved.  Pt is an every day smoker and smokes about 7-8 cigarettes a day. However, she has cut down to about 4 cigarettes a day since onset of symptoms. No other concerns this visit.  She is 3 months postpartum and is not breast feeding.  [facility]  Past Medical History  Diagnosis Date  . Hx of chlamydia infection   . HSV-2 (herpes simplex virus 2) infection   . Abnormal Pap smear   . Hx of urinary tract infection   . Labial abscess   . Abnormal Pap smear   . BV (bacterial vaginosis) 06/27/2013  . Hypertension    Past Surgical History  Procedure Laterality Date  . Cesarean section    . Dilation and curettage of uterus    . Cesarean  section N/A 01/20/2014    Procedure: CESAREAN SECTION;  Surgeon: Emily Filbert, MD;  Location: [facility];  Service: Obstetrics;  Laterality: N/A;  . Oophorectomy Right 01/20/2014    Procedure: OOPHORECTOMY;  Surgeon: Emily Filbert, MD;  Location: [facility];  Service: Obstetrics;  Laterality: Right;   Family History  Problem Relation Age of Onset  . Diabetes Mother   . Hypertension Mother   . Thyroid disease Mother   . Hypertension Father    History  Substance Use Topics  . Smoking status: Current Every Day Smoker -- 0.50 packs/day for 3 years    Types: Cigarettes  . Smokeless tobacco: Never Used  . Alcohol Use: Yes     Comment: occasionally   OB History   Grav Para Term Preterm Abortions TAB SAB Ect Mult Living   7 3 3  0 4 2 1 1  3      Review of Systems  Constitutional: Negative for fever and chills.  HENT: Negative for congestion, ear pain and sore throat.   Eyes: Negative for redness and itching.  Respiratory: Positive for cough and chest tightness. Negative for shortness of breath and wheezing.   Gastrointestinal: Negative for nausea and vomiting.  Musculoskeletal: Negative for joint swelling.  Psychiatric/Behavioral: Negative for confusion.      Allergies  Review of patient's allergies indicates no known allergies.  Home Medications   Prior to Admission medications  Medication Sig Start Date End Date Taking? Authorizing Provider  acetaminophen (TYLENOL) 500 MG tablet Take 500 mg by mouth as needed for mild pain, moderate pain, fever or headache.     Historical Provider, MD  calcium carbonate (TUMS - DOSED IN MG ELEMENTAL CALCIUM) 500 MG chewable tablet Chew 2 tablets by mouth as needed for indigestion or heartburn.    Historical Provider, MD  cyclobenzaprine (FLEXERIL) 10 MG tablet Take 1 tablet (10 mg total) by mouth every 8 (eight) hours as needed for muscle spasms. 09/19/13   Christin Fudge, CNM  HYDROcodone-acetaminophen (NORCO/VICODIN) 5-325 MG per tablet Take 1  tablet by mouth every 6 (six) hours as needed for moderate pain or severe pain. 02/07/14   Jonnie Kind, MD  ibuprofen (ADVIL,MOTRIN) 600 MG tablet Take 1 tablet (600 mg total) by mouth every 6 (six) hours. 01/23/14   Beverlyn Roux, MD  metroNIDAZOLE (FLAGYL) 500 MG tablet Take 1 tablet (500 mg total) by mouth 2 (two) times daily. X 7 days 03/19/14   Tawnya Crook, CNM  Prenatal Vit-Fe Fumarate-FA (PRENATAL MULTIVITAMIN) TABS tablet Take 1 tablet by mouth daily at 12 noon. 12/13/13   Florian Buff, MD  pseudoephedrine (SUDAFED) 30 MG tablet Take 30 mg by mouth every 4 (four) hours as needed for congestion.    Historical Provider, MD   Triage Vitals: BP 142/85  Pulse 71  Temp(Src) 98 F (36.7 C) (Oral)  Resp 24  Ht 5\' 1"  (1.549 m)  Wt 170 lb (77.111 kg)  BMI 32.14 kg/m2  SpO2 100%  LMP 04/13/2014   Physical Exam  Constitutional: She is oriented to person, place, and time. She appears well-developed and well-nourished.  HENT:  Head: Normocephalic and atraumatic.  Right Ear: Tympanic membrane and ear canal normal.  Left Ear: Tympanic membrane and ear canal normal.  Nose: No mucosal edema or rhinorrhea.  Mouth/Throat: Uvula is midline, oropharynx is clear and moist and mucous membranes are normal. No oropharyngeal exudate, posterior oropharyngeal edema, posterior oropharyngeal erythema or tonsillar abscesses.  Eyes: Conjunctivae are normal.  Cardiovascular: Normal rate and normal heart sounds.   Pulmonary/Chest: Effort normal. No respiratory distress. She has no wheezes. She has no rales.  Abdominal: Soft. There is no tenderness.  Musculoskeletal: Normal range of motion. She exhibits no edema and no tenderness.  Neurological: She is alert and oriented to person, place, and time.  Skin: Skin is warm and dry. No rash noted.  Psychiatric: She has a normal mood and affect.    ED Course  Procedures (including critical care time)  DIAGNOSTIC STUDIES: Oxygen Saturation is 100% on  RA, Normal by my interpretation.    COORDINATION OF CARE: 3:42 PM- Will give Tessalon pearls. Will order DG Chest 2 View. Discussed treatment plan with pt at bedside and pt agreed to plan.     Labs Review Labs Reviewed - No data to display  Imaging Review Dg Chest 2 View  04/13/2014   CLINICAL DATA:  Cough.  EXAM: CHEST  2 VIEW  COMPARISON:  Chest x-ray 02/15/2010.  FINDINGS: Lung volumes are normal. No consolidative airspace disease. No pleural effusions. No pneumothorax. No pulmonary nodule or mass noted. Pulmonary vasculature and the cardiomediastinal silhouette are within normal limits. Axial No radiographic evidence of acute cardiopulmonary disease.  IMPRESSION: No active cardiopulmonary disease.   Electronically Signed   By: Vinnie Langton M.D.   On: 04/13/2014 16:17     EKG Interpretation None      MDM  Final diagnoses:  Cough, persistent    Patients labs and/or radiological studies were viewed and considered during the medical decision making and disposition process.  Discussed cxr results with patient.  Prescribed tessalon and encouraged smoking cessation, information given.  Also suggestive increased fluid intake,  Cough lozenges.  Teaspoon of honey prn cough.  Pt has no risk factors or dvt or PE.  Suspect a chronic cough from recent uri prolonged by smoking exposure.  I personally performed the services described in this documentation, which was scribed in my presence. The recorded information has been reviewed and is accurate.    Evalee Jefferson, PA-C 04/14/14 [phone]

## 2014-04-13 NOTE — Discharge Instructions (Signed)
Cough, Adult  A cough is a reflex that helps clear your throat and airways. It can help heal the body or may be a reaction to an irritated airway. A cough may only last 2 or 3 weeks (acute) or may last more than 8 weeks (chronic).  CAUSES Acute cough:  Viral or bacterial infections. Chronic cough:  Infections.  Allergies.  Asthma.  Post-nasal drip.  Smoking.  Heartburn or acid reflux.  Some medicines.  Chronic lung problems (COPD).  Cancer. SYMPTOMS   Cough.  Fever.  Chest pain.  Increased breathing rate.  High-pitched whistling sound when breathing (wheezing).  Colored mucus that you cough up (sputum). TREATMENT   A bacterial cough may be treated with antibiotic medicine.  A viral cough must run its course and will not respond to antibiotics.  Your caregiver may recommend other treatments if you have a chronic cough. HOME CARE INSTRUCTIONS   Only take over-the-counter or prescription medicines for pain, discomfort, or fever as directed by your caregiver. Use cough suppressants only as directed by your caregiver.  Use a cold steam vaporizer or humidifier in your bedroom or home to help loosen secretions.  Sleep in a semi-upright position if your cough is worse at night.  Rest as needed.  Stop smoking if you smoke. SEEK IMMEDIATE MEDICAL CARE IF:   You have pus in your sputum.  Your cough starts to worsen.  You cannot control your cough with suppressants and are losing sleep.  You begin coughing up blood.  You have difficulty breathing.  You develop pain which is getting worse or is uncontrolled with medicine.  You have a fever. MAKE SURE YOU:   Understand these instructions.  Will watch your condition.  Will get help right away if you are not doing well or get worse. Document Released: 06/10/2011 Document Revised: 03/05/2012 Document Reviewed: 06/10/2011 [facility] Patient Information 2014 [employer].   Your xray is normal today.   Use the medicine prescribed for your cough.  Consider smoking cessation.

## 2014-04-13 NOTE — ED Notes (Signed)
Patient c/o cough x4 weeks. Per patient progressively getting worse. Patient reports cough is occasionally productive with thick green sputum. Unsure of any fevers. Patient states "I think I might have had a fever last week."

## 2014-04-14 NOTE — ED Provider Notes (Signed)
Medical screening examination/treatment/procedure(s) were performed by non-physician practitioner and as supervising physician I was immediately available for consultation/collaboration.   EKG Interpretation None       Babette Relic, MD 04/14/14 2207

## 2014-04-21 ENCOUNTER — Emergency Department (HOSPITAL_COMMUNITY)
Admission: EM | Admit: 2014-04-21 | Discharge: 2014-04-21 | Disposition: A | Discharge status: Home / Self Care | Payer: Medicaid Other | Attending: Emergency Medicine | Admitting: Emergency Medicine

## 2014-04-21 ENCOUNTER — Other Ambulatory Visit: Payer: Medicaid Other | Admitting: Women's Health

## 2014-04-21 ENCOUNTER — Encounter (HOSPITAL_COMMUNITY): Payer: Self-pay | Admitting: Emergency Medicine

## 2014-04-21 DIAGNOSIS — Z79899 Other long term (current) drug therapy: Secondary | ICD-10-CM | POA: Insufficient documentation

## 2014-04-21 DIAGNOSIS — Z8619 Personal history of other infectious and parasitic diseases: Secondary | ICD-10-CM | POA: Insufficient documentation

## 2014-04-21 DIAGNOSIS — Z8742 Personal history of other diseases of the female genital tract: Secondary | ICD-10-CM | POA: Insufficient documentation

## 2014-04-21 DIAGNOSIS — I10 Essential (primary) hypertension: Secondary | ICD-10-CM | POA: Insufficient documentation

## 2014-04-21 DIAGNOSIS — J020 Streptococcal pharyngitis: Secondary | ICD-10-CM

## 2014-04-21 DIAGNOSIS — Z87440 Personal history of urinary (tract) infections: Secondary | ICD-10-CM | POA: Insufficient documentation

## 2014-04-21 DIAGNOSIS — F17200 Nicotine dependence, unspecified, uncomplicated: Secondary | ICD-10-CM | POA: Insufficient documentation

## 2014-04-21 LAB — RAPID STREP SCREEN (MED CTR MEBANE ONLY): Streptococcus, Group A Screen (Direct): POSITIVE — AB

## 2014-04-21 MED ORDER — PENICILLIN G BENZATHINE 1200000 UNIT/2ML IM SUSP
1.2000 10*6.[IU] | Freq: Once | INTRAMUSCULAR | Status: AC
  Administered 2014-04-21: 1.2 10*6.[IU] via INTRAMUSCULAR
  Filled 2014-04-21: qty 2

## 2014-04-21 NOTE — ED Notes (Signed)
Pt c/o sore throat and pain with swallowing since yesterday morning. Pt also reports cough with yellow mucus. Denies fever.

## 2014-04-21 NOTE — ED Notes (Signed)
Pt c/o sore throat/ general malaise  x1 day.

## 2014-04-21 NOTE — Discharge Instructions (Signed)
Strep Throat  Strep throat is an infection of the throat. It is caused by a germ. Strep throat spreads from person to person by coughing, sneezing, or close contact.  HOME CARE  · Rinse your mouth (gargle) with warm salt water (1 teaspoon salt in 1 cup of water). Do this 3 to 4 times per day or as needed for comfort.  · Family members with a sore throat or fever should see a doctor.  · Make sure everyone in your house washes their hands well.  · Do not share food, drinking cups, or personal items.  · Eat soft foods until your sore throat gets better.  · Drink enough water and fluids to keep your pee (urine) clear or pale yellow.  · Rest.  · Stay home from school, daycare, or work until you have taken medicine for 24 hours.  · Only take medicine as told by your doctor.  · Take your medicine as told. Finish it even if you start to feel better.  GET HELP RIGHT AWAY IF:   · You have new problems, such as throwing up (vomiting) or bad headaches.  · You have a stiff or painful neck, chest pain, trouble breathing, or trouble swallowing.  · You have very bad throat pain, drooling, or changes in your voice.  · Your neck puffs up (swells) or gets red and tender.  · You have a fever.  · You are very tired, your mouth is dry, or you are peeing less than normal.  · You cannot wake up completely.  · You get a rash, cough, or earache.  · You have green, yellow-brown, or bloody spit.  · Your pain does not get better with medicine.  MAKE SURE YOU:   · Understand these instructions.  · Will watch your condition.  · Will get help right away if you are not doing well or get worse.  Document Released: 05/30/2008 Document Revised: 03/05/2012 Document Reviewed: 02/10/2011  [employer]® Patient Information ©2014 [employer].

## 2014-04-21 NOTE — ED Provider Notes (Signed)
CSN: [id number]     Arrival date & time 04/21/14  1191 History   First MD Initiated Contact with Patient 04/21/14 [phone]     Chief Complaint  Patient presents with  . Sore Throat     (Consider location/radiation/quality/duration/timing/severity/associated sxs/prior Treatment) Patient is a 28 y.o. female presenting with pharyngitis. The history is provided by the patient.  Sore Throat This is a new problem. The current episode started yesterday. The problem occurs constantly. The problem has been unchanged. Associated symptoms include chills, fatigue, a fever and a sore throat. Pertinent negatives include no abdominal pain, arthralgias, change in bowel habit, chest pain, congestion, coughing, headaches, joint swelling, myalgias, nausea, neck pain, numbness, rash, swollen glands, urinary symptoms, vertigo, visual change, vomiting or weakness. The symptoms are aggravated by swallowing. Treatments tried: OTC cold medications. The treatment provided no relief.    Past Medical History  Diagnosis Date  . Hx of chlamydia infection   . HSV-2 (herpes simplex virus 2) infection   . Abnormal Pap smear   . Hx of urinary tract infection   . Labial abscess   . Abnormal Pap smear   . BV (bacterial vaginosis) 06/27/2013  . Hypertension    Past Surgical History  Procedure Laterality Date  . Cesarean section    . Dilation and curettage of uterus    . Cesarean section N/A 01/20/2014    Procedure: CESAREAN SECTION;  Surgeon: Emily Filbert, MD;  Location: [facility];  Service: Obstetrics;  Laterality: N/A;  . Oophorectomy Right 01/20/2014    Procedure: OOPHORECTOMY;  Surgeon: Emily Filbert, MD;  Location: [facility];  Service: Obstetrics;  Laterality: Right;   Family History  Problem Relation Age of Onset  . Diabetes Mother   . Hypertension Mother   . Thyroid disease Mother   . Hypertension Father    History  Substance Use Topics  . Smoking status: Current Every Day Smoker -- 0.50 packs/day for 3 years   Types: Cigarettes  . Smokeless tobacco: Never Used  . Alcohol Use: Yes     Comment: occasionally   OB History   Grav Para Term Preterm Abortions TAB SAB Ect Mult Living   7 3 3  0 4 2 1 1  3      Review of Systems  Constitutional: Positive for fever, chills and fatigue. Negative for activity change and appetite change.  HENT: Positive for sore throat. Negative for congestion, facial swelling, rhinorrhea and trouble swallowing.   Eyes: Negative for visual disturbance.  Respiratory: Negative for cough, chest tightness, shortness of breath, wheezing and stridor.   Cardiovascular: Negative for chest pain.  Gastrointestinal: Negative for nausea, vomiting, abdominal pain and change in bowel habit.  Genitourinary: Negative for dysuria.  Musculoskeletal: Negative for arthralgias, joint swelling, myalgias, neck pain and neck stiffness.  Skin: Negative.  Negative for rash.  Neurological: Negative for dizziness, vertigo, weakness, numbness and headaches.  Hematological: Negative for adenopathy.  Psychiatric/Behavioral: Negative for confusion.  All other systems reviewed and are negative.     Allergies  Review of patient's allergies indicates no known allergies.  Home Medications   Prior to Admission medications   Medication Sig Start Date End Date Taking? Authorizing Provider  acetaminophen (TYLENOL) 500 MG tablet Take 500 mg by mouth as needed for mild pain, moderate pain, fever or headache.     Historical Provider, MD  benzonatate (TESSALON) 100 MG capsule Take 2 capsules (200 mg total) by mouth 3 (three) times daily as needed for cough. 04/13/14  Evalee Jefferson, PA-C  calcium carbonate (TUMS - DOSED IN MG ELEMENTAL CALCIUM) 500 MG chewable tablet Chew 2 tablets by mouth as needed for indigestion or heartburn.    Historical Provider, MD  cyclobenzaprine (FLEXERIL) 10 MG tablet Take 1 tablet (10 mg total) by mouth every 8 (eight) hours as needed for muscle spasms. 09/19/13   Christin Fudge, CNM  HYDROcodone-acetaminophen (NORCO/VICODIN) 5-325 MG per tablet Take 1 tablet by mouth every 6 (six) hours as needed for moderate pain or severe pain. 02/07/14   Jonnie Kind, MD  ibuprofen (ADVIL,MOTRIN) 600 MG tablet Take 1 tablet (600 mg total) by mouth every 6 (six) hours. 01/23/14   Beverlyn Roux, MD  metroNIDAZOLE (FLAGYL) 500 MG tablet Take 1 tablet (500 mg total) by mouth 2 (two) times daily. X 7 days 03/19/14   Tawnya Crook, CNM  Prenatal Vit-Fe Fumarate-FA (PRENATAL MULTIVITAMIN) TABS tablet Take 1 tablet by mouth daily at 12 noon. 12/13/13   Florian Buff, MD  pseudoephedrine (SUDAFED) 30 MG tablet Take 30 mg by mouth every 4 (four) hours as needed for congestion.    Historical Provider, MD   BP 136/82  Pulse 79  Temp(Src) 98.7 F (37.1 C) (Oral)  Resp 18  Ht 5\' 1"  (1.549 m)  Wt 170 lb (77.111 kg)  BMI 32.14 kg/m2  SpO2 97%  LMP 04/13/2014 Physical Exam  Nursing note and vitals reviewed. Constitutional: She is oriented to person, place, and time. She appears well-developed and well-nourished. No distress.  HENT:  Head: Normocephalic and atraumatic.  Right Ear: Tympanic membrane and ear canal normal.  Left Ear: Tympanic membrane and ear canal normal.  Mouth/Throat: Uvula is midline and mucous membranes are normal. No trismus in the jaw. No uvula swelling. Posterior oropharyngeal edema and posterior oropharyngeal erythema present. No oropharyngeal exudate or tonsillar abscesses.  Significant erythema of the oropharynx with mild edema present.  Mild petechiae of the soft palate.  No exudates.  Uvula midline.  No PTA  Neck: Normal range of motion. Neck supple.  Cardiovascular: Normal rate, regular rhythm, normal heart sounds and intact distal pulses.   No murmur heard. Pulmonary/Chest: Effort normal and breath sounds normal. No respiratory distress.  Abdominal: Soft. She exhibits no distension and no mass. There is no splenomegaly. There is no  tenderness. There is no rebound and no guarding.  Musculoskeletal: Normal range of motion.  Lymphadenopathy:    She has no cervical adenopathy.  Neurological: She is alert and oriented to person, place, and time. She exhibits normal muscle tone. Coordination normal.  Skin: Skin is warm and dry.    ED Course  Procedures (including critical care time) Labs Review Labs Reviewed  RAPID STREP SCREEN - Abnormal; Notable for the following:    Streptococcus, Group A Screen (Direct) POSITIVE (*)    All other components within normal limits    Imaging Review No results found.   EKG Interpretation None      MDM   Final diagnoses:  Streptococcal pharyngitis    Patient is well-appearing and nontoxic. Vital signs are stable. Airway is patent and without evidence of peritonsillar abscess.  Rapid strep is positive, pt agrees to Bicillin LA and to continue tylenol/ibuprofen if needed for pain/fever and encourage fluids.  Patient agrees to close followup with her primary care physician. She appears stable for discharge at this time and agrees to care plan.      Zavion Sleight L. Vanessa Teresita, PA-C 04/21/14 2128

## 2014-04-22 NOTE — ED Provider Notes (Signed)
Medical screening examination/treatment/procedure(s) were performed by non-physician practitioner and as supervising physician I was immediately available for consultation/collaboration.   EKG Interpretation None       Mervin Kung, MD 04/22/14 [phone]

## 2014-04-29 ENCOUNTER — Other Ambulatory Visit: Payer: Medicaid Other | Admitting: Women's Health

## 2014-10-27 ENCOUNTER — Encounter (HOSPITAL_COMMUNITY): Payer: Self-pay | Admitting: Emergency Medicine

## 2015-02-17 ENCOUNTER — Ambulatory Visit: Payer: Medicaid Other | Admitting: Advanced Practice Midwife

## 2015-02-17 ENCOUNTER — Encounter: Payer: Self-pay | Admitting: Advanced Practice Midwife

## 2015-02-17 ENCOUNTER — Ambulatory Visit (INDEPENDENT_AMBULATORY_CARE_PROVIDER_SITE_OTHER): Payer: Medicaid Other | Admitting: Advanced Practice Midwife

## 2015-02-17 VITALS — BP 130/82 | Ht 61.0 in | Wt 176.0 lb

## 2015-02-17 DIAGNOSIS — Z3046 Encounter for surveillance of implantable subdermal contraceptive: Secondary | ICD-10-CM

## 2015-02-17 DIAGNOSIS — Z3202 Encounter for pregnancy test, result negative: Secondary | ICD-10-CM

## 2015-02-17 DIAGNOSIS — Z30432 Encounter for removal of intrauterine contraceptive device: Secondary | ICD-10-CM | POA: Insufficient documentation

## 2015-02-17 DIAGNOSIS — Z3049 Encounter for surveillance of other contraceptives: Secondary | ICD-10-CM

## 2015-02-17 LAB — POCT URINE PREGNANCY: PREG TEST UR: NEGATIVE

## 2015-02-17 MED ORDER — NORGESTIM-ETH ESTRAD TRIPHASIC 0.18/0.215/0.25 MG-25 MCG PO TABS: 1.0000 | ORAL_TABLET | Freq: Every day | ORAL | Status: DC

## 2015-02-17 NOTE — Progress Notes (Signed)
  HPI:  Jamie [name] 29 y.o. here for Nexplanon removal.  She has noticed HA and increased appetite since getting it almost a year ago. Her future plans for birth control are COC's, requesting ortho tri cyclen (generic).  Past Medical History: Past Medical History  Diagnosis Date  . Hx of chlamydia infection   . HSV-2 (herpes simplex virus 2) infection   . Abnormal Pap smear   . Hx of urinary tract infection   . Labial abscess   . Abnormal Pap smear   . BV (bacterial vaginosis) 06/27/2013  . Hypertension     Past Surgical History: Past Surgical History  Procedure Laterality Date  . Cesarean section    . Dilation and curettage of uterus    . Cesarean section N/A 01/20/2014    Procedure: CESAREAN SECTION;  Surgeon: Emily Filbert, MD;  Location: [facility];  Service: Obstetrics;  Laterality: N/A;  . Oophorectomy Right 01/20/2014    Procedure: OOPHORECTOMY;  Surgeon: Emily Filbert, MD;  Location: [facility];  Service: Obstetrics;  Laterality: Right;    Family History: Family History  Problem Relation Age of Onset  . Diabetes Mother   . Hypertension Mother   . Thyroid disease Mother   . Hypertension Father     Social History: History  Substance Use Topics  . Smoking status: Current Every Day Smoker -- 0.50 packs/day for 3 years    Types: Cigarettes  . Smokeless tobacco: Never Used  . Alcohol Use: Yes     Comment: occasionally    Allergies: No Known Allergies  Meds:  Current Outpatient Prescriptions on File Prior to Visit  Medication Sig Dispense Refill  . acetaminophen (TYLENOL) 500 MG tablet Take 500 mg by mouth as needed for mild pain, moderate pain, fever or headache.      No current facility-administered medications on file prior to visit.       Patient given informed consent for removal of her Nexplanon, time out was performed.  Signed copy in the chart.  Appropriate time out taken. Implanon site identified.  Area prepped in usual sterile fashon. One cc of 1%  lidocaine was used to anesthetize the area at the distal end of the implant. A small stab incision was made right beside the implant on the distal portion.  The Nexplanon rod was grasped using hemostats and removed without difficulty.  There was less than 3 cc blood loss. There were no complications.  A small amount of antibiotic ointment and steri-strips were applied over the small incision.  A pressure bandage was applied to reduce any bruising.  The patient tolerated the procedure well and was given post procedure instructions.

## 2015-03-05 ENCOUNTER — Other Ambulatory Visit: Payer: Medicaid Other | Admitting: Advanced Practice Midwife

## 2015-03-11 ENCOUNTER — Encounter: Payer: Self-pay | Admitting: Advanced Practice Midwife

## 2015-03-11 ENCOUNTER — Other Ambulatory Visit: Payer: Medicaid Other | Admitting: Advanced Practice Midwife

## 2015-06-14 ENCOUNTER — Emergency Department (HOSPITAL_COMMUNITY)
Admission: EM | Admit: 2015-06-14 | Discharge: 2015-06-14 | Disposition: A | Discharge status: Home / Self Care | Payer: Medicaid Other | Attending: Emergency Medicine | Admitting: Emergency Medicine

## 2015-06-14 ENCOUNTER — Emergency Department (HOSPITAL_COMMUNITY): Payer: Medicaid Other

## 2015-06-14 ENCOUNTER — Encounter (HOSPITAL_COMMUNITY): Payer: Self-pay | Admitting: Emergency Medicine

## 2015-06-14 DIAGNOSIS — R1031 Right lower quadrant pain: Secondary | ICD-10-CM | POA: Diagnosis not present

## 2015-06-14 DIAGNOSIS — Z9889 Other specified postprocedural states: Secondary | ICD-10-CM | POA: Diagnosis not present

## 2015-06-14 DIAGNOSIS — Z87440 Personal history of urinary (tract) infections: Secondary | ICD-10-CM | POA: Diagnosis not present

## 2015-06-14 DIAGNOSIS — Z90710 Acquired absence of both cervix and uterus: Secondary | ICD-10-CM | POA: Insufficient documentation

## 2015-06-14 DIAGNOSIS — Z79899 Other long term (current) drug therapy: Secondary | ICD-10-CM | POA: Diagnosis not present

## 2015-06-14 DIAGNOSIS — I10 Essential (primary) hypertension: Secondary | ICD-10-CM | POA: Insufficient documentation

## 2015-06-14 DIAGNOSIS — R109 Unspecified abdominal pain: Secondary | ICD-10-CM

## 2015-06-14 DIAGNOSIS — Z3202 Encounter for pregnancy test, result negative: Secondary | ICD-10-CM | POA: Insufficient documentation

## 2015-06-14 DIAGNOSIS — R110 Nausea: Secondary | ICD-10-CM | POA: Diagnosis not present

## 2015-06-14 DIAGNOSIS — Z720 Tobacco use: Secondary | ICD-10-CM | POA: Insufficient documentation

## 2015-06-14 DIAGNOSIS — Z8619 Personal history of other infectious and parasitic diseases: Secondary | ICD-10-CM | POA: Diagnosis not present

## 2015-06-14 LAB — URINALYSIS, ROUTINE W REFLEX MICROSCOPIC
BILIRUBIN URINE: NEGATIVE
GLUCOSE, UA: NEGATIVE mg/dL
Hgb urine dipstick: NEGATIVE
Ketones, ur: NEGATIVE mg/dL
Leukocytes, UA: NEGATIVE
Nitrite: NEGATIVE
Protein, ur: NEGATIVE mg/dL
Specific Gravity, Urine: 1.025 (ref 1.005–1.030)
Urobilinogen, UA: 0.2 mg/dL (ref 0.0–1.0)
pH: 6 (ref 5.0–8.0)

## 2015-06-14 LAB — COMPREHENSIVE METABOLIC PANEL
ALK PHOS: 62 U/L (ref 38–126)
ALT: 19 U/L (ref 14–54)
AST: 19 U/L (ref 15–41)
Albumin: 4.2 g/dL (ref 3.5–5.0)
Anion gap: 8 (ref 5–15)
BUN: 10 mg/dL (ref 6–20)
CO2: 26 mmol/L (ref 22–32)
CREATININE: 0.64 mg/dL (ref 0.44–1.00)
Calcium: 8.8 mg/dL — ABNORMAL LOW (ref 8.9–10.3)
Chloride: 105 mmol/L (ref 101–111)
GFR calc non Af Amer: >60 mL/min (ref >60)
Glucose, Bld: 96 mg/dL (ref 65–99)
POTASSIUM: 3.6 mmol/L (ref 3.5–5.1)
SODIUM: 139 mmol/L (ref 135–145)
TOTAL PROTEIN: 7.5 g/dL (ref 6.5–8.1)
Total Bilirubin: 0.6 mg/dL (ref 0.3–1.2)

## 2015-06-14 LAB — CBC WITH DIFFERENTIAL/PLATELET
BASOS ABS: 0 10*3/uL (ref 0.0–0.1)
Basophils Relative: 0 % (ref 0–1)
EOS PCT: 1 % (ref 0–5)
Eosinophils Absolute: 0.2 10*3/uL (ref 0.0–0.7)
HEMATOCRIT: 41.6 % (ref 36.0–46.0)
Hemoglobin: 14.4 g/dL (ref 12.0–15.0)
Lymphocytes Relative: 23 % (ref 12–46)
Lymphs Abs: 2.9 10*3/uL (ref 0.7–4.0)
MCH: 31.2 pg (ref 26.0–34.0)
MCHC: 34.6 g/dL (ref 30.0–36.0)
MCV: 90.2 fL (ref 78.0–100.0)
Monocytes Absolute: 0.7 10*3/uL (ref 0.1–1.0)
Monocytes Relative: 5 % (ref 3–12)
NEUTROS PCT: 71 % (ref 43–77)
Neutro Abs: 8.6 10*3/uL — ABNORMAL HIGH (ref 1.7–7.7)
Platelets: 213 10*3/uL (ref 150–400)
RBC: 4.61 MIL/uL (ref 3.87–5.11)
RDW: 11.9 % (ref 11.5–15.5)
WBC: 12.3 10*3/uL — ABNORMAL HIGH (ref 4.0–10.5)

## 2015-06-14 LAB — LIPASE, BLOOD: Lipase: 32 U/L (ref 22–51)

## 2015-06-14 LAB — PREGNANCY, URINE: PREG TEST UR: NEGATIVE

## 2015-06-14 MED ORDER — SODIUM CHLORIDE 0.9 % IV SOLN
INTRAVENOUS | Status: DC
  Administered 2015-06-14: 13:00:00 via INTRAVENOUS

## 2015-06-14 MED ORDER — IOHEXOL 300 MG/ML  SOLN
25.0000 mL | Freq: Once | INTRAMUSCULAR | Status: AC | PRN
  Administered 2015-06-14: 25 mL via ORAL

## 2015-06-14 MED ORDER — SODIUM CHLORIDE 0.9 % IV BOLUS (SEPSIS)
500.0000 mL | Freq: Once | INTRAVENOUS | Status: AC
  Administered 2015-06-14: 500 mL via INTRAVENOUS

## 2015-06-14 MED ORDER — NAPROXEN 500 MG PO TABS: 500.0000 mg | ORAL_TABLET | Freq: Two times a day (BID) | ORAL | Status: DC

## 2015-06-14 MED ORDER — IOHEXOL 300 MG/ML  SOLN
100.0000 mL | Freq: Once | INTRAMUSCULAR | Status: AC | PRN
  Administered 2015-06-14: 100 mL via INTRAVENOUS

## 2015-06-14 NOTE — ED Notes (Signed)
Pt reports RLQ abd pain x 2 days. C/O nausea, no vomiting or diarrhea.

## 2015-06-14 NOTE — ED Notes (Signed)
Pt drinking oral contrast. Instructed pt to notify staff when finished. Pt verbalized understanding.

## 2015-06-14 NOTE — Discharge Instructions (Signed)
Workup file for the right lower quadrant abdominal pain without any significant findings. Take Naprosyn as directed. Return for any new or worse symptoms.

## 2015-06-14 NOTE — ED Provider Notes (Addendum)
CSN: [id number]     Arrival date & time 06/14/15  1216 History  This chart was scribed for Jamie Sorrow, MD by Marlowe Kays, ED Scribe. This patient was seen in room APA12/APA12 and the patient's care was started at 12:50 PM.  Chief Complaint  Patient presents with  . Abdominal Pain   HPI  HPI Comments:  Jamie [name] is a 29 y.o. female who presents to the Emergency Department complaining of sharp RLQ abdominal pain that began yesterday morning. Pt rates the pain at 6/10. She states she was menstruating yesterday but has since stopped. She reports associated nausea. Pt has not done anything to treat the pain. Certain movements make the pain worse. She denies alleviating factors. She denies fever, chills, rhinorrhea, sore throat, visual disturbance, cough, SOB, CP, leg swelling, diarrhea, vomiting, dysuria, back pain, rash or HA.   Past Medical History  Diagnosis Date  . Hx of chlamydia infection   . HSV-2 (herpes simplex virus 2) infection   . Abnormal Pap smear   . Hx of urinary tract infection   . Labial abscess   . Abnormal Pap smear   . BV (bacterial vaginosis) 06/27/2013  . Hypertension    Past Surgical History  Procedure Laterality Date  . Cesarean section    . Dilation and curettage of uterus    . Cesarean section N/A 01/20/2014    Procedure: CESAREAN SECTION;  Surgeon: Emily Filbert, MD;  Location: [facility];  Service: Obstetrics;  Laterality: N/A;  . Oophorectomy Right 01/20/2014    Procedure: OOPHORECTOMY;  Surgeon: Emily Filbert, MD;  Location: [facility];  Service: Obstetrics;  Laterality: Right;   Family History  Problem Relation Age of Onset  . Diabetes Mother   . Hypertension Mother   . Thyroid disease Mother   . Hypertension Father    History  Substance Use Topics  . Smoking status: Current Every Day Smoker -- 0.50 packs/day for 3 years    Types: Cigarettes  . Smokeless tobacco: Never Used  . Alcohol Use: Yes     Comment: occasionally   OB History     Gravida Para Term Preterm AB TAB SAB Ectopic Multiple Living   7 3 3  0 4 2 1 1  3      Review of Systems  Constitutional: Negative for fever and chills.  HENT: Negative for rhinorrhea and sore throat.   Eyes: Negative for visual disturbance.  Respiratory: Negative for cough and shortness of breath.   Cardiovascular: Negative for chest pain and leg swelling.  Gastrointestinal: Positive for nausea and abdominal pain. Negative for vomiting and diarrhea.  Genitourinary: Negative for dysuria.  Musculoskeletal: Negative for back pain and neck pain.  Skin: Negative for rash.  Neurological: Negative for dizziness, light-headedness and headaches.  Hematological: Does not bruise/bleed easily.  Psychiatric/Behavioral: Negative for confusion.    Allergies  Review of patient's allergies indicates no known allergies.  Home Medications   Prior to Admission medications   Medication Sig Start Date End Date Taking? Authorizing Provider  acetaminophen (TYLENOL) 500 MG tablet Take 500 mg by mouth as needed for mild pain, moderate pain, fever or headache.    Yes Historical Provider, MD  ibuprofen (ADVIL,MOTRIN) 200 MG tablet Take 600 mg by mouth every 6 (six) hours as needed for moderate pain.   Yes Historical Provider, MD  Multiple Vitamins-Minerals (EMERGEN-C VITAMIN C PO) Take 1 Package by mouth daily as needed (sinus congestion).   Yes Historical Provider, MD  Norgestimate-Ethinyl Estradiol  Triphasic (ORTHO TRI-CYCLEN LO) 0.18/0.215/0.25 MG-25 MCG tab Take 1 tablet by mouth daily. 02/17/15  Yes Christin Fudge, CNM  naproxen (NAPROSYN) 500 MG tablet Take 1 tablet (500 mg total) by mouth 2 (two) times daily. 06/14/15   Jamie Sorrow, MD   Triage Vitals: BP 121/77 mmHg  Pulse 62  Temp(Src) 98.3 F (36.8 C) (Oral)  Resp 20  Ht 5\' 1"  (1.549 m)  Wt 170 lb (77.111 kg)  BMI 32.14 kg/m2  SpO2 100%  LMP 06/13/2015 Physical Exam  Constitutional: She is oriented to person, place, and time. She  appears well-developed and well-nourished.  HENT:  Head: Normocephalic and atraumatic.  Mouth/Throat: Oropharynx is clear and moist.  Eyes: EOM are normal. Pupils are equal, round, and reactive to light. No scleral icterus.  Neck: Normal range of motion.  Cardiovascular: Normal rate, regular rhythm and normal heart sounds.   No murmur heard. Pulmonary/Chest: Effort normal and breath sounds normal. No respiratory distress.  Abdominal: Soft. Bowel sounds are normal. There is no tenderness.  Musculoskeletal: Normal range of motion. She exhibits no edema.  Neurological: She is alert and oriented to person, place, and time. No cranial nerve deficit. She exhibits normal muscle tone. Coordination normal.  Skin: Skin is warm and dry.  Psychiatric: She has a normal mood and affect. Her behavior is normal.  Nursing note and vitals reviewed.   ED Course  Procedures (including critical care time) DIAGNOSTIC STUDIES: Oxygen Saturation is 100% on RA, normal by my interpretation.   COORDINATION OF CARE: 12:53 PM- Will order antinausea medication, urinalysis and CT abdomen. Pt verbalizes understanding and agrees to plan.  Medications  0.9 %  sodium chloride infusion ( Intravenous New Bag/Given 06/14/15 1308)  sodium chloride 0.9 % bolus 500 mL (0 mLs Intravenous Stopped 06/14/15 1422)  iohexol (OMNIPAQUE) 300 MG/ML solution 25 mL (25 mLs Oral Contrast Given 06/14/15 1422)  iohexol (OMNIPAQUE) 300 MG/ML solution 100 mL (100 mLs Intravenous Contrast Given 06/14/15 1422)    Labs Review Labs Reviewed  COMPREHENSIVE METABOLIC PANEL - Abnormal; Notable for the following:    Calcium 8.8 (*)    All other components within normal limits  CBC WITH DIFFERENTIAL/PLATELET - Abnormal; Notable for the following:    WBC 12.3 (*)    Neutro Abs 8.6 (*)    All other components within normal limits  LIPASE, BLOOD  URINALYSIS, ROUTINE W REFLEX MICROSCOPIC (NOT AT [facility])  PREGNANCY, URINE   Results for orders  placed or performed during the hospital encounter of 06/14/15  Comprehensive metabolic panel  Result Value Ref Range   Sodium 139 135 - 145 mmol/L   Potassium 3.6 3.5 - 5.1 mmol/L   Chloride 105 101 - 111 mmol/L   CO2 26 22 - 32 mmol/L   Glucose, Bld 96 65 - 99 mg/dL   BUN 10 6 - 20 mg/dL   Creatinine, Ser 0.64 0.44 - 1.00 mg/dL   Calcium 8.8 (L) 8.9 - 10.3 mg/dL   Total Protein 7.5 6.5 - 8.1 g/dL   Albumin 4.2 3.5 - 5.0 g/dL   AST 19 15 - 41 U/L   ALT 19 14 - 54 U/L   Alkaline Phosphatase 62 38 - 126 U/L   Total Bilirubin 0.6 0.3 - 1.2 mg/dL   GFR calc non Af Amer >60 >60 mL/min   GFR calc Af Amer >60 >60 mL/min   Anion gap 8 5 - 15  CBC with Differential/Platelet  Result Value Ref Range   WBC 12.3 (H)  4.0 - 10.5 K/uL   RBC 4.61 3.87 - 5.11 MIL/uL   Hemoglobin 14.4 12.0 - 15.0 g/dL   HCT 41.6 36.0 - 46.0 %   MCV 90.2 78.0 - 100.0 fL   MCH 31.2 26.0 - 34.0 pg   MCHC 34.6 30.0 - 36.0 g/dL   RDW 11.9 11.5 - 15.5 %   Platelets 213 150 - 400 K/uL   Neutrophils Relative % 71 43 - 77 %   Neutro Abs 8.6 (H) 1.7 - 7.7 K/uL   Lymphocytes Relative 23 12 - 46 %   Lymphs Abs 2.9 0.7 - 4.0 K/uL   Monocytes Relative 5 3 - 12 %   Monocytes Absolute 0.7 0.1 - 1.0 K/uL   Eosinophils Relative 1 0 - 5 %   Eosinophils Absolute 0.2 0.0 - 0.7 K/uL   Basophils Relative 0 0 - 1 %   Basophils Absolute 0.0 0.0 - 0.1 K/uL  Lipase, blood  Result Value Ref Range   Lipase 32 22 - 51 U/L  Urinalysis, Routine w reflex microscopic (not at [facility])  Result Value Ref Range   Color, Urine YELLOW YELLOW   APPearance CLEAR CLEAR   Specific Gravity, Urine 1.025 1.005 - 1.030   pH 6.0 5.0 - 8.0   Glucose, UA NEGATIVE NEGATIVE mg/dL   Hgb urine dipstick NEGATIVE NEGATIVE   Bilirubin Urine NEGATIVE NEGATIVE   Ketones, ur NEGATIVE NEGATIVE mg/dL   Protein, ur NEGATIVE NEGATIVE mg/dL   Urobilinogen, UA 0.2 0.0 - 1.0 mg/dL   Nitrite NEGATIVE NEGATIVE   Leukocytes, UA NEGATIVE NEGATIVE  Pregnancy, urine   Result Value Ref Range   Preg Test, Ur NEGATIVE NEGATIVE     Imaging Review Ct Abdomen Pelvis W Contrast  06/14/2015   CLINICAL DATA:  Acute right lower quadrant abdominal pain.  EXAM: CT ABDOMEN AND PELVIS WITH CONTRAST  TECHNIQUE: Multidetector CT imaging of the abdomen and pelvis was performed using the standard protocol following bolus administration of intravenous contrast.  CONTRAST:  186mL OMNIPAQUE IOHEXOL 300 MG/ML SOLN, 44mL OMNIPAQUE IOHEXOL 300 MG/ML SOLN  COMPARISON:  None.  FINDINGS: Visualized lung bases appear normal. No significant osseous abnormality is noted.  No gallstones are noted. The liver, spleen and pancreas appear normal. Adrenal glands and kidneys appear normal. No hydronephrosis or renal obstruction is noted. The appendix appears normal. There is no evidence of bowel obstruction. Urinary bladder appears normal. Uterus and ovaries appear normal. No significant adenopathy is noted.  IMPRESSION: No significant abnormality seen in the abdomen or pelvis.   Electronically Signed   By: Marijo Conception, M.D.   On: 06/14/2015 15:02     EKG Interpretation None      MDM   Final diagnoses:  Abdominal pain    Patient with right lower quadrant abdominal pain that started yesterday. Pre-see test negative urinalysis negative. Patient does have a leukocytosis. No electrolyte abnormalities. Patient with mild tenderness to palpation right lower quadrant no guarding. Patient without any vaginal discharge.  CT scan of the abdomen done to rule out appendicitis.  CT scan without any significant abnormalities. Will treat with Naprosyn and have patient return for any new or worse symptoms.  I personally performed the services described in this documentation, which was scribed in my presence. The recorded information has been reviewed and is accurate.    Jamie Sorrow, MD 06/14/15 1449  Jamie Sorrow, MD 06/14/15 [phone]

## 2015-06-14 NOTE — ED Notes (Signed)
Pt made aware a urine sample was needed.

## 2015-06-14 NOTE — ED Notes (Signed)
MD Zackowsky at bedside.

## 2015-06-22 ENCOUNTER — Encounter: Payer: Self-pay | Admitting: Obstetrics and Gynecology

## 2015-06-22 ENCOUNTER — Ambulatory Visit (INDEPENDENT_AMBULATORY_CARE_PROVIDER_SITE_OTHER): Payer: Medicaid Other | Admitting: Obstetrics and Gynecology

## 2015-06-22 VITALS — BP 120/80 | Ht 61.0 in | Wt 168.5 lb

## 2015-06-22 DIAGNOSIS — R102 Pelvic and perineal pain: Secondary | ICD-10-CM

## 2015-06-22 MED ORDER — METRONIDAZOLE 500 MG PO TABS: 500.0000 mg | ORAL_TABLET | Freq: Two times a day (BID) | ORAL | Status: DC

## 2015-06-22 NOTE — Progress Notes (Signed)
Patient ID: Jamie [name], female   DOB: [birth year], 29 y.o.   MRN: [id number] Pt here today for lower abdominal pain. Pt states that she has had pressure when she voids and also after she has urinated. Pt states that she has to push when voiding.

## 2015-06-22 NOTE — Addendum Note (Signed)
Addended by: Jonnie Kind on: 06/22/2015 03:16 PM   Modules accepted: Orders

## 2015-06-22 NOTE — Progress Notes (Addendum)
Patient ID: Jamie [name], female   DOB: 1986/10/20, 29 y.o.   MRN: [id number]   [facility] Visit  Patient name: Jamie [name] MRN [id number]  Date of birth: [birth year]  CC & HPI:  Jamie [name] is a 29 y.o. female presenting today for pressure-like suprapubic abdominal pain that began 11 days  ago around the end of her menstrual cycle. Patient states that she has had pressure when she voids and also after she has urinated. Pt states that she has to push when voiding.   ROS:  A complete review of systems was obtained and all systems are negative except as noted in the HPI and PMH.  Seen in ED for similar sx 8 days ago.   Pertinent History Reviewed:   Reviewed: Significant for Cesarean section and right oophorectomy Medical         Past Medical History  Diagnosis Date  . Hx of chlamydia infection   . HSV-2 (herpes simplex virus 2) infection   . Abnormal Pap smear   . Hx of urinary tract infection   . Labial abscess   . Abnormal Pap smear   . BV (bacterial vaginosis) 06/27/2013  . Hypertension                               Surgical Hx:    Past Surgical History  Procedure Laterality Date  . Cesarean section    . Dilation and curettage of uterus    . Cesarean section N/A 01/20/2014    Procedure: CESAREAN SECTION;  Surgeon: Emily Filbert, MD;  Location: [facility];  Service: Obstetrics;  Laterality: N/A;  . Oophorectomy Right 01/20/2014    Procedure: OOPHORECTOMY;  Surgeon: Emily Filbert, MD;  Location: [facility];  Service: Obstetrics;  Laterality: Right;   Medications: Reviewed & Updated - see associated section                       Current outpatient prescriptions:  .  acetaminophen (TYLENOL) 500 MG tablet, Take 500 mg by mouth as needed for mild pain, moderate pain, fever or headache. , Disp: , Rfl:  .  ibuprofen (ADVIL,MOTRIN) 200 MG tablet, Take 600 mg by mouth every 6 (six) hours as needed for moderate pain., Disp: , Rfl:  .  Norgestimate-Ethinyl Estradiol  Triphasic (ORTHO TRI-CYCLEN LO) 0.18/0.215/0.25 MG-25 MCG tab, Take 1 tablet by mouth daily., Disp: 1 Package, Rfl: 11   Social History: Reviewed -  reports that she has been smoking Cigarettes.  She has a 1.5 pack-year smoking history. She has never used smokeless tobacco.  Objective Findings:  Vitals: Blood pressure 120/80, height 5\' 1"  (1.549 m), weight 168 lb 8 oz (76.431 kg), last menstrual period 06/13/2015, not currently breastfeeding.  Physical Examination: General appearance - alert, well appearing, and in no distress, oriented to person, place, and time and overweight Mental status - alert, oriented to person, place, and time, normal mood, behavior, speech, dress, motor activity, and thought processes, affect appropriate to mood Pelvic - normal external genitalia Vagina: good support, normal secretions Uterus: anterior, non-tender, negative CMT Adnexa: non-tender, mobile  Urinalysis: Negative  Assessment & Plan:   A:  1. Normal female exam at this time 2. Resolved Lower abd pain   P:  1. Reassure pt of normalcy of exam. 2 advise f/u here for sx,  3 empiric tx with metrogel , based on pt concerns  This chart was SCRIBED for [name], MD by Stephania Fragmin, ED Scribe. This patient was seen in room 1, and the patient's care was started at 3:00 PM.  I personally performed the services described in this documentation, which was SCRIBED in my presence. The recorded information has been reviewed and considered accurate. It has been edited as necessary during review. Jonnie Kind, MD

## 2016-07-08 IMAGING — CT CT ABD-PELV W/ CM
2 of 4 series · 17 of 46 positions shown, 19 images · IV contrast (Omnipaque 300)
Comparison: None.

CLINICAL DATA: Acute right lower quadrant abdominal pain.

EXAM:
CT ABDOMEN AND PELVIS WITH CONTRAST
TECHNIQUE: Multidetector CT imaging of the abdomen and pelvis was performed
using the standard protocol following bolus administration of
intravenous contrast.
CONTRAST:  100mL OMNIPAQUE IOHEXOL 300 MG/ML SOLN, 25mL OMNIPAQUE
IOHEXOL 300 MG/ML SOLN

[Series 2: abd_pel_with 5.0 b40f · axial · 0.74mm/px · z∈[-476,-76]mm · 14 of 88 slices shown, 16 images]
[im 4/88  soft-tissue]
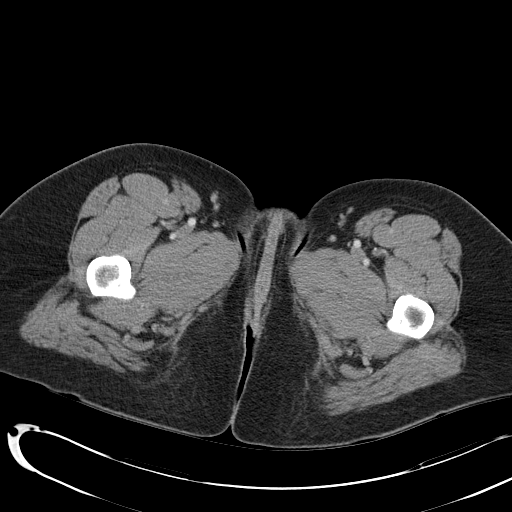
[im 4/88  bone]
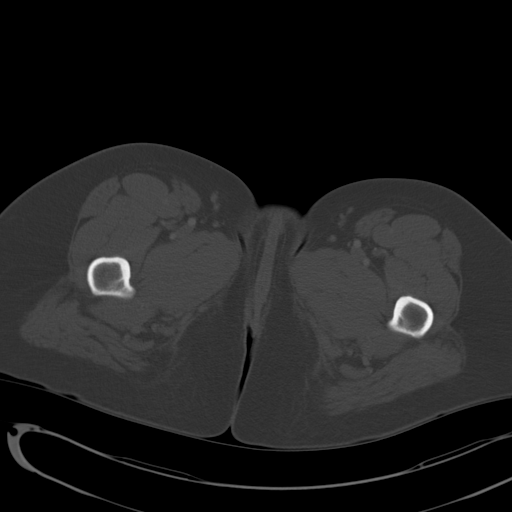
[im 11/88  soft-tissue]
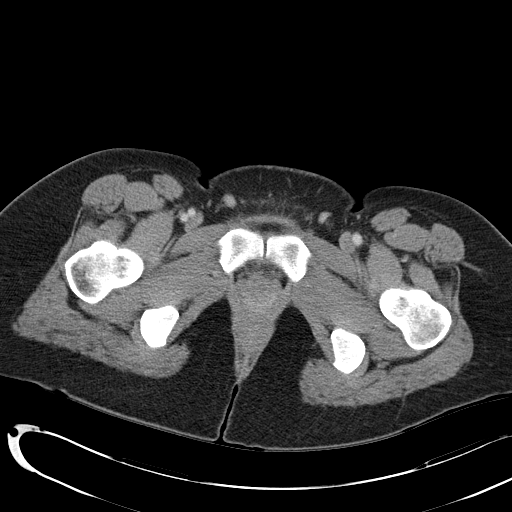
[im 18/88  soft-tissue]
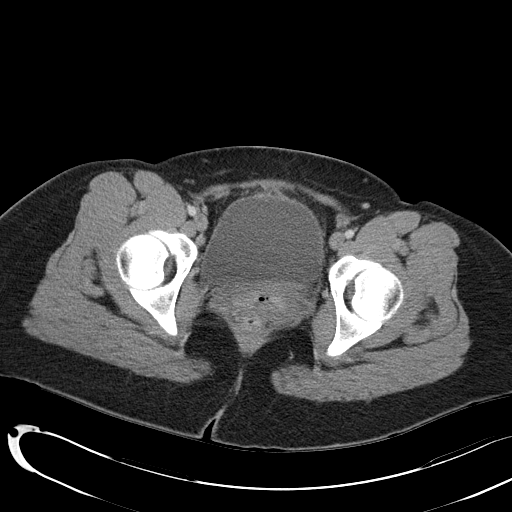
[im 25/88  soft-tissue]
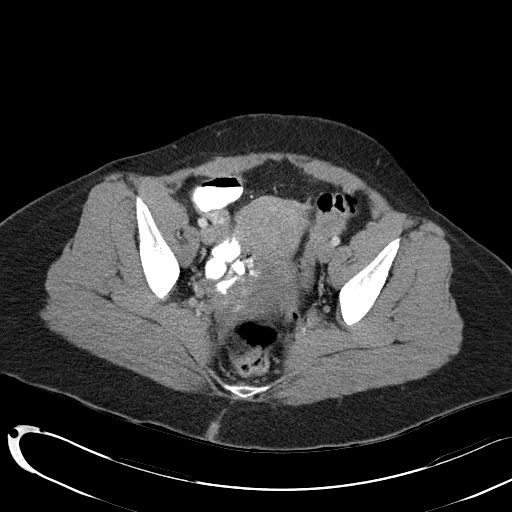
[im 28/88  soft-tissue]
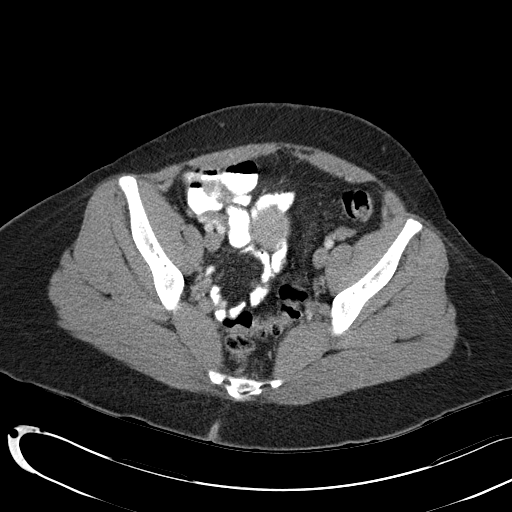
[im 35/88  soft-tissue]
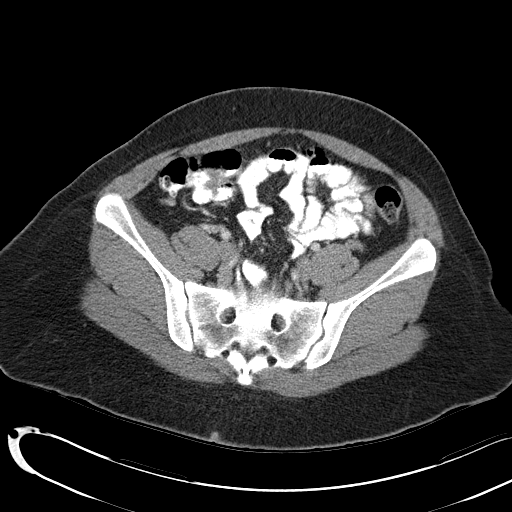
[im 42/88  soft-tissue]
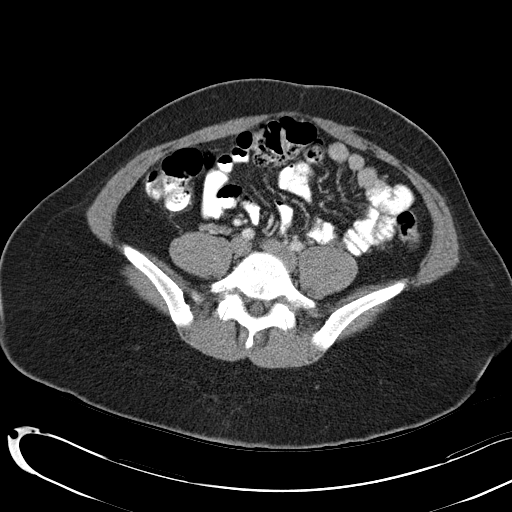
[im 46/88  soft-tissue]
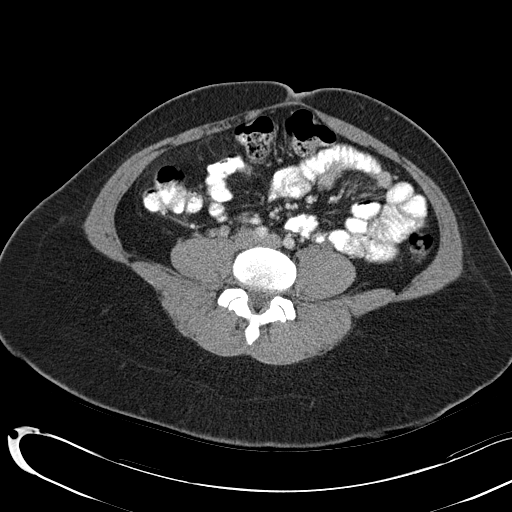
[im 53/88  soft-tissue]
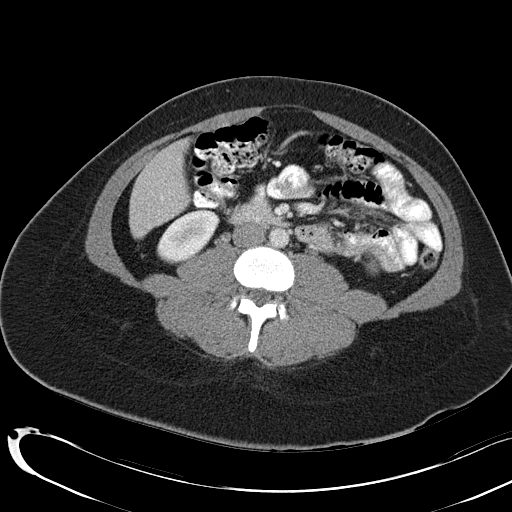
[im 53/88  bone]
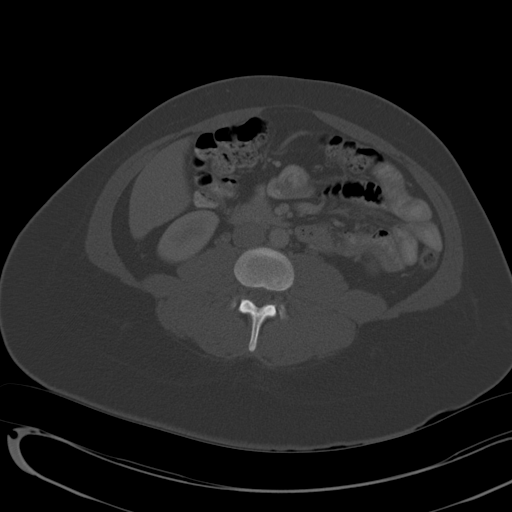
[im 60/88  soft-tissue]
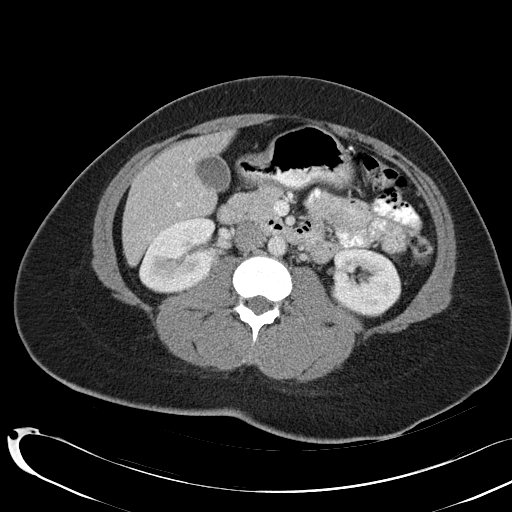
[im 67/88  soft-tissue]
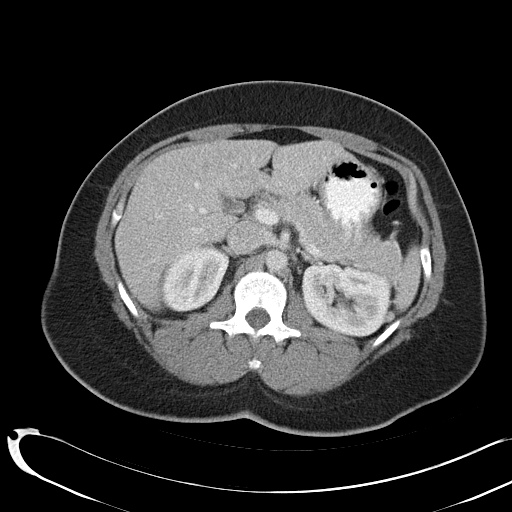
[im 70/88  soft-tissue]
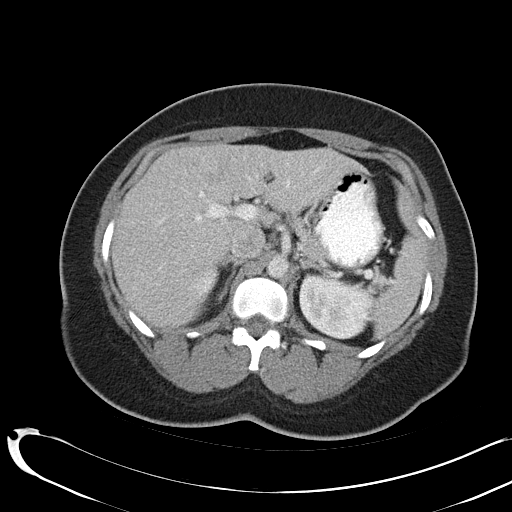
[im 77/88  soft-tissue]
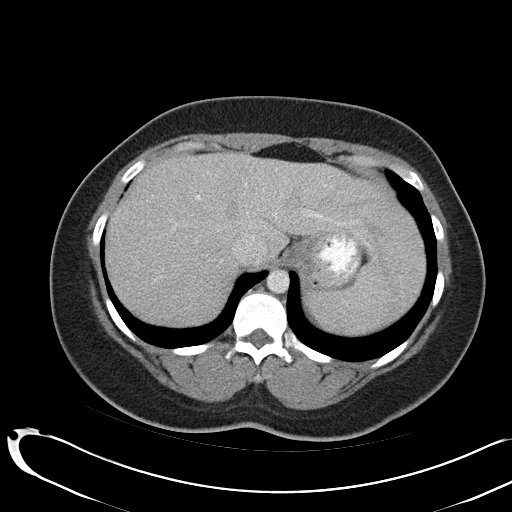
[im 84/88  soft-tissue]
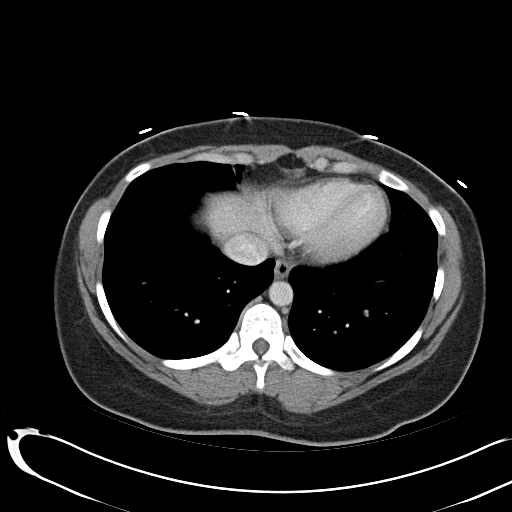

[Series 3: abd_pel_with 3.0 spo · coronal · 0.75mm/px · 3 of 81 slices shown]
[im 27/81  soft-tissue]
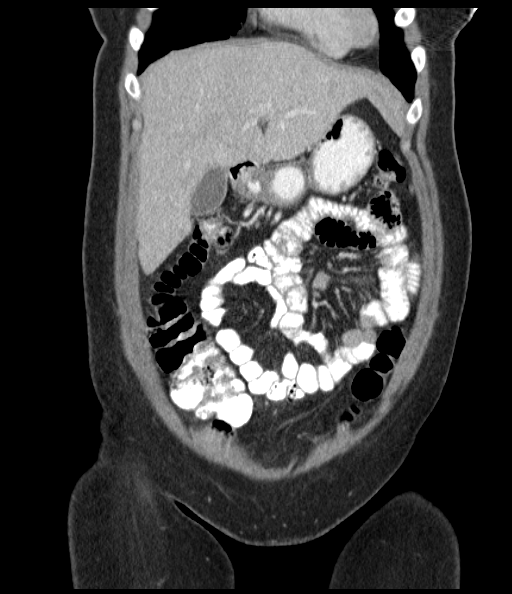
[im 36/81  soft-tissue]
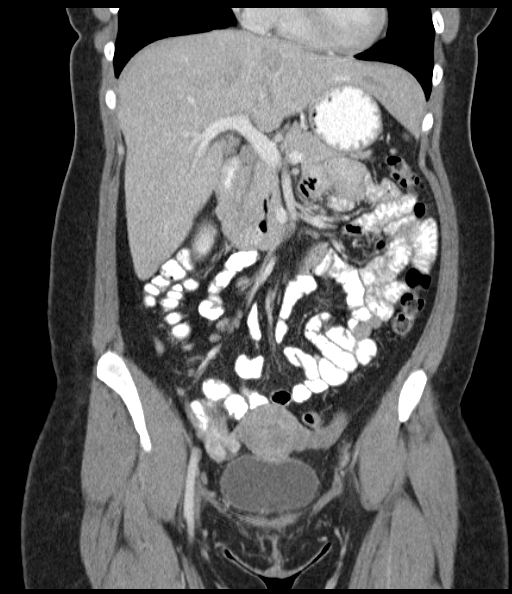
[im 45/81  soft-tissue]
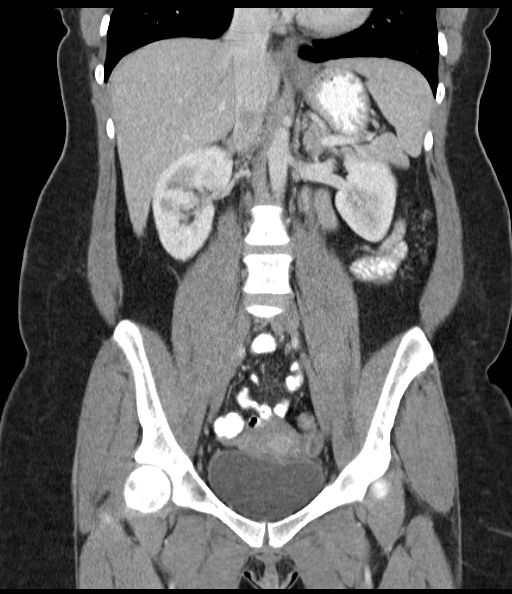

[17 of 46 positions shown; findings below may reference images not displayed]

FINDINGS: Visualized lung bases appear normal. No significant osseous
abnormality is noted.

No gallstones are noted. The liver, spleen and pancreas appear
normal. Adrenal glands and kidneys appear normal. No hydronephrosis
or renal obstruction is noted. The appendix appears normal. There is
no evidence of bowel obstruction. Urinary bladder appears normal.
Uterus and ovaries appear normal. No significant adenopathy is
noted.
IMPRESSION: No significant abnormality seen in the abdomen or pelvis.

## 2016-11-21 ENCOUNTER — Inpatient Hospital Stay (HOSPITAL_COMMUNITY): Payer: Medicaid Other

## 2016-11-21 ENCOUNTER — Encounter (HOSPITAL_COMMUNITY): Payer: Self-pay | Admitting: *Deleted

## 2016-11-21 ENCOUNTER — Inpatient Hospital Stay (HOSPITAL_COMMUNITY)
Admission: AD | Admit: 2016-11-21 | Discharge: 2016-11-22 | Disposition: A | Discharge status: Home / Self Care | Payer: Medicaid Other | Source: Ambulatory Visit | Attending: Obstetrics and Gynecology | Admitting: Obstetrics and Gynecology

## 2016-11-21 DIAGNOSIS — R102 Pelvic and perineal pain: Secondary | ICD-10-CM | POA: Insufficient documentation

## 2016-11-21 DIAGNOSIS — Z833 Family history of diabetes mellitus: Secondary | ICD-10-CM | POA: Insufficient documentation

## 2016-11-21 DIAGNOSIS — A599 Trichomoniasis, unspecified: Secondary | ICD-10-CM

## 2016-11-21 DIAGNOSIS — Z3A00 Weeks of gestation of pregnancy not specified: Secondary | ICD-10-CM | POA: Diagnosis not present

## 2016-11-21 DIAGNOSIS — O3680X0 Pregnancy with inconclusive fetal viability, not applicable or unspecified: Secondary | ICD-10-CM

## 2016-11-21 DIAGNOSIS — O26891 Other specified pregnancy related conditions, first trimester: Secondary | ICD-10-CM | POA: Diagnosis not present

## 2016-11-21 DIAGNOSIS — Z8349 Family history of other endocrine, nutritional and metabolic diseases: Secondary | ICD-10-CM | POA: Insufficient documentation

## 2016-11-21 DIAGNOSIS — Z8249 Family history of ischemic heart disease and other diseases of the circulatory system: Secondary | ICD-10-CM | POA: Insufficient documentation

## 2016-11-21 DIAGNOSIS — O99331 Smoking (tobacco) complicating pregnancy, first trimester: Secondary | ICD-10-CM | POA: Diagnosis not present

## 2016-11-21 DIAGNOSIS — F17210 Nicotine dependence, cigarettes, uncomplicated: Secondary | ICD-10-CM | POA: Diagnosis not present

## 2016-11-21 DIAGNOSIS — O98911 Unspecified maternal infectious and parasitic disease complicating pregnancy, first trimester: Secondary | ICD-10-CM | POA: Diagnosis not present

## 2016-11-21 HISTORY — DX: Trichomoniasis, unspecified: A59.9

## 2016-11-21 HISTORY — DX: Gastro-esophageal reflux disease without esophagitis: K21.9

## 2016-11-21 LAB — URINE MICROSCOPIC-ADD ON: RBC / HPF: NONE SEEN RBC/hpf (ref 0–5)

## 2016-11-21 LAB — URINALYSIS, ROUTINE W REFLEX MICROSCOPIC
Bilirubin Urine: NEGATIVE
Glucose, UA: NEGATIVE mg/dL
HGB URINE DIPSTICK: NEGATIVE
Ketones, ur: NEGATIVE mg/dL
Nitrite: NEGATIVE
Protein, ur: NEGATIVE mg/dL
pH: 6 (ref 5.0–8.0)

## 2016-11-21 LAB — CBC
HEMATOCRIT: 37.3 % (ref 36.0–46.0)
HEMOGLOBIN: 13.1 g/dL (ref 12.0–15.0)
MCH: 31.3 pg (ref 26.0–34.0)
MCHC: 35.1 g/dL (ref 30.0–36.0)
MCV: 89 fL (ref 78.0–100.0)
Platelets: 187 10*3/uL (ref 150–400)
RBC: 4.19 MIL/uL (ref 3.87–5.11)
RDW: 12.2 % (ref 11.5–15.5)
WBC: 14.4 10*3/uL — ABNORMAL HIGH (ref 4.0–10.5)

## 2016-11-21 LAB — WET PREP, GENITAL
Clue Cells Wet Prep HPF POC: NONE SEEN
SPERM: NONE SEEN
YEAST WET PREP: NONE SEEN

## 2016-11-21 LAB — POCT PREGNANCY, URINE: Preg Test, Ur: POSITIVE — AB

## 2016-11-21 LAB — HCG, QUANTITATIVE, PREGNANCY: hCG, Beta Chain, Quant, S: 1346 m[IU]/mL — ABNORMAL HIGH (ref <5)

## 2016-11-21 MED ORDER — PROMETHAZINE HCL 25 MG PO TABS
25.0000 mg | ORAL_TABLET | Freq: Once | ORAL | Status: DC
  Filled 2016-11-21: qty 1

## 2016-11-21 MED ORDER — ONDANSETRON HCL 4 MG PO TABS
4.0000 mg | ORAL_TABLET | Freq: Once | ORAL | Status: AC
  Administered 2016-11-21: 4 mg via ORAL
  Filled 2016-11-21: qty 1

## 2016-11-21 MED ORDER — METRONIDAZOLE 500 MG PO TABS
2000.0000 mg | ORAL_TABLET | Freq: Once | ORAL | Status: AC
  Administered 2016-11-21: 2000 mg via ORAL
  Filled 2016-11-21: qty 4

## 2016-11-21 NOTE — MAU Provider Note (Signed)
History     CSN: [id number]  Arrival date and time: 11/21/16 2101   First Provider Initiated Contact with Patient 11/21/16 2130      Chief Complaint  Patient presents with  . Pelvic Pain   Pelvic Pain  The patient's primary symptoms include pelvic pain. This is a new problem. The current episode started in the past 7 days. The problem occurs constantly. The problem has been gradually worsening. Pain severity now: 10/10. The problem affects the right side. She is pregnant. Associated symptoms include abdominal pain and nausea. Pertinent negatives include no chills, constipation, diarrhea, dysuria, fever, frequency, urgency or vomiting. The vaginal discharge was thick and white. There has been no bleeding. Nothing aggravates the symptoms. She has tried oral narcotics (stool softner ) for the symptoms. The treatment provided mild relief. Contraceptive use: Nexplanon removed "in the summertime" Her menstrual history has been regular (LMP 10/24/16 ).   Past Medical History:  Diagnosis Date  . Abnormal Pap smear   . Abnormal Pap smear   . BV (bacterial vaginosis) 06/27/2013  . GERD (gastroesophageal reflux disease)   . HSV-2 (herpes simplex virus 2) infection   . Hx of chlamydia infection   . Hx of urinary tract infection   . Hypertension   . Labial abscess     Past Surgical History:  Procedure Laterality Date  . CESAREAN SECTION    . CESAREAN SECTION N/A 01/20/2014   Procedure: CESAREAN SECTION;  Surgeon: Emily Filbert, MD;  Location: [facility];  Service: Obstetrics;  Laterality: N/A;  . DILATION AND CURETTAGE OF UTERUS    . OOPHORECTOMY Right 01/20/2014   Procedure: OOPHORECTOMY;  Surgeon: Emily Filbert, MD;  Location: [facility];  Service: Obstetrics;  Laterality: Right;    Family History  Problem Relation Age of Onset  . Diabetes Mother   . Hypertension Mother   . Thyroid disease Mother   . Hypertension Father     Social History  Substance Use Topics  . Smoking status: Current Every  Day Smoker    Packs/day: 0.50    Years: 3.00    Types: Cigarettes  . Smokeless tobacco: Never Used  . Alcohol use Yes     Comment: occasionally    Allergies: No Known Allergies  Prescriptions Prior to Admission  Medication Sig Dispense Refill Last Dose  . acetaminophen (TYLENOL) 500 MG tablet Take 500 mg by mouth as needed for mild pain, moderate pain, fever or headache.    Taking  . ibuprofen (ADVIL,MOTRIN) 200 MG tablet Take 600 mg by mouth every 6 (six) hours as needed for moderate pain.   Taking  . metroNIDAZOLE (FLAGYL) 500 MG tablet Take 1 tablet (500 mg total) by mouth 2 (two) times daily. 14 tablet 0   . Norgestimate-Ethinyl Estradiol Triphasic (ORTHO TRI-CYCLEN LO) 0.18/0.215/0.25 MG-25 MCG tab Take 1 tablet by mouth daily. 1 Package 11 Taking    Review of Systems  Constitutional: Negative for chills and fever.  Gastrointestinal: Positive for abdominal pain and nausea. Negative for constipation, diarrhea and vomiting.  Genitourinary: Positive for pelvic pain. Negative for dysuria, frequency and urgency.   Physical Exam   Blood pressure 132/90, pulse 85, temperature 98.2 F (36.8 C), temperature source Oral, resp. rate 16, height 5\' 1"  (1.549 m), weight 145 lb (65.8 kg), last menstrual period 10/24/2016, SpO2 100 %.  Physical Exam  Nursing note and vitals reviewed. Constitutional: She is oriented to person, place, and time. She appears well-developed and well-nourished. No distress.  HENT:  Head: Normocephalic.  Cardiovascular: Normal rate.   Respiratory: Effort normal.  GI: Soft. There is no tenderness. There is no rebound.  Genitourinary:  Genitourinary Comments: Bimanual exam: no CMT No adnexa tenderness  Neurological: She is alert and oriented to person, place, and time.  Skin: Skin is warm and dry.  Psychiatric: She has a normal mood and affect.   Results for orders placed or performed during the hospital encounter of 11/21/16 (from the past 24 hour(s))   Urinalysis, Routine w reflex microscopic (not at [facility])     Status: Abnormal   Collection Time: 11/21/16  9:13 PM  Result Value Ref Range   Color, Urine YELLOW YELLOW   APPearance CLOUDY (A) CLEAR   Specific Gravity, Urine >1.030 (H) 1.005 - 1.030   pH 6.0 5.0 - 8.0   Glucose, UA NEGATIVE NEGATIVE mg/dL   Hgb urine dipstick NEGATIVE NEGATIVE   Bilirubin Urine NEGATIVE NEGATIVE   Ketones, ur NEGATIVE NEGATIVE mg/dL   Protein, ur NEGATIVE NEGATIVE mg/dL   Nitrite NEGATIVE NEGATIVE   Leukocytes, UA SMALL (A) NEGATIVE  Urine microscopic-add on     Status: Abnormal   Collection Time: 11/21/16  9:13 PM  Result Value Ref Range   Squamous Epithelial / LPF 6-30 (A) NONE SEEN   WBC, UA 6-30 0 - 5 WBC/hpf   RBC / HPF NONE SEEN 0 - 5 RBC/hpf   Bacteria, UA FEW (A) NONE SEEN   Trichomonas, UA PRESENT    Urine-Other MUCOUS PRESENT   Pregnancy, urine POC     Status: Abnormal   Collection Time: 11/21/16  9:16 PM  Result Value Ref Range   Preg Test, Ur POSITIVE (A) NEGATIVE  Wet prep, genital     Status: Abnormal   Collection Time: 11/21/16  9:35 PM  Result Value Ref Range   Yeast Wet Prep HPF POC NONE SEEN NONE SEEN   Trich, Wet Prep PRESENT (A) NONE SEEN   Clue Cells Wet Prep HPF POC NONE SEEN NONE SEEN   WBC, Wet Prep HPF POC MANY (A) NONE SEEN   Sperm NONE SEEN   CBC     Status: Abnormal   Collection Time: 11/21/16  9:43 PM  Result Value Ref Range   WBC 14.4 (H) 4.0 - 10.5 K/uL   RBC 4.19 3.87 - 5.11 MIL/uL   Hemoglobin 13.1 12.0 - 15.0 g/dL   HCT 37.3 36.0 - 46.0 %   MCV 89.0 78.0 - 100.0 fL   MCH 31.3 26.0 - 34.0 pg   MCHC 35.1 30.0 - 36.0 g/dL   RDW 12.2 11.5 - 15.5 %   Platelets 187 150 - 400 K/uL  hCG, quantitative, pregnancy     Status: Abnormal   Collection Time: 11/21/16  9:43 PM  Result Value Ref Range   hCG, Beta Chain, Quant, S 1,346 (H) <5 mIU/mL     MAU Course  Procedures  MDM  Treated with 2g flagyl here in MAU today.  Discussed partner treatment   Assessment and Plan   1. Pregnancy, location unknown   2. Pelvic pain affecting pregnancy in first trimester, antepartum   3. Trichomoniasis    DC home Comfort measures reviewed  1st Trimester precautions  Bleeding precautions Ectopic precautions RX: none Return to MAU as needed   [place] for [facility] Follow up.   Specialty:  Obstetrics and Gynecology Why:  Thursday morning at 9:00 for repeat blood work Contact information: [address]  [place] [phone]           Mathis Bud 11/21/2016, 9:31 PM

## 2016-11-21 NOTE — MAU Note (Addendum)
Patient presents with right lower abdominal pain that started on Thursday, but got much worse today. Patient thought it may be related to eating too much so she took a laxative that helped her have a BM, but still having pain.  States she took a Hydrocodone today that did not help the pain at all.  Rates it a 10/10.  Denies vaginal bleeding, but having some thick white discharge.  No itching or burning.  LMP 10/30.  Took +HPT today.

## 2016-11-22 DIAGNOSIS — R102 Pelvic and perineal pain: Secondary | ICD-10-CM

## 2016-11-22 DIAGNOSIS — O26891 Other specified pregnancy related conditions, first trimester: Secondary | ICD-10-CM

## 2016-11-22 LAB — HIV ANTIBODY (ROUTINE TESTING W REFLEX): HIV SCREEN 4TH GENERATION: NONREACTIVE

## 2016-11-22 LAB — RPR: RPR Ser Ql: NONREACTIVE

## 2016-11-22 NOTE — Discharge Instructions (Signed)
Trichomoniasis °Trichomoniasis is an infection caused by an organism called Trichomonas. The infection can affect both women and men. In women, the outer female genitalia and the vagina are affected. In men, the penis is mainly affected, but the prostate and other reproductive organs can also be involved. Trichomoniasis is a sexually transmitted infection (STI) and is most often passed to another person through sexual contact.  °RISK FACTORS °Having unprotected sexual intercourse. °Having sexual intercourse with an infected partner. °SIGNS AND SYMPTOMS  °Symptoms of trichomoniasis in women include: °Abnormal gray-green frothy vaginal discharge. °Itching and irritation of the vagina. °Itching and irritation of the area outside the vagina. °Symptoms of trichomoniasis in men include:  °Penile discharge with or without pain. °Pain during urination. This results from inflammation of the urethra. °DIAGNOSIS  °Trichomoniasis may be found during a Pap test or physical exam. Your health care provider may use one of the following methods to help diagnose this infection: °Testing the pH of the vagina with a test tape. °Using a vaginal swab test that checks for the Trichomonas organism. A test is available that provides results within a few minutes. °Examining a urine sample. °Testing vaginal secretions. °Your health care provider may test you for other STIs, including HIV. °TREATMENT  °You may be given medicine to fight the infection. Women should inform their health care provider if they could be or are pregnant. Some medicines used to treat the infection should not be taken during pregnancy. °Your health care provider may recommend over-the-counter medicines or creams to decrease itching or irritation. °Your sexual partner will need to be treated if infected. °Your health care provider may test you for infection again 3 months after treatment. °HOME CARE INSTRUCTIONS  °Take medicines only as directed by your health care  provider. °Take over-the-counter medicine for itching or irritation as directed by your health care provider. °Do not have sexual intercourse while you have the infection. °Women should not douche or wear tampons while they have the infection. °Discuss your infection with your partner. Your partner may have gotten the infection from you, or you may have gotten it from your partner. °Have your sex partner get examined and treated if necessary. °Practice safe, informed, and protected sex. °See your health care provider for other STI testing. °SEEK MEDICAL CARE IF:  °You still have symptoms after you finish your medicine. °You develop abdominal pain. °You have pain when you urinate. °You have bleeding after sexual intercourse. °You develop a rash. °Your medicine makes you sick or makes you throw up (vomit). °MAKE SURE YOU: °Understand these instructions. °Will watch your condition. °Will get help right away if you are not doing well or get worse. °This information is not intended to replace advice given to you by your health care provider. Make sure you discuss any questions you have with your health care provider. °Document Released: 06/07/2001 Document Revised: 01/02/2015 Document Reviewed: 09/23/2013 °Elsevier Interactive Patient Education © 2017 [employer] Inc[employer] °  °

## 2016-11-23 LAB — GC/CHLAMYDIA PROBE AMP (~~LOC~~) NOT AT ARMC
CHLAMYDIA, DNA PROBE: NEGATIVE
NEISSERIA GONORRHEA: NEGATIVE

## 2016-11-24 ENCOUNTER — Ambulatory Visit: Payer: Medicaid Other | Admitting: *Deleted

## 2016-11-24 ENCOUNTER — Encounter: Payer: Self-pay | Admitting: *Deleted

## 2016-11-24 DIAGNOSIS — O3680X0 Pregnancy with inconclusive fetal viability, not applicable or unspecified: Secondary | ICD-10-CM

## 2016-11-24 LAB — HCG, QUANTITATIVE, PREGNANCY: HCG, BETA CHAIN, QUANT, S: 2418 m[IU]/mL — AB (ref <5)

## 2016-11-24 NOTE — Progress Notes (Signed)
Pt in for stat hcg level today. She denies pain or bleeding. Pt gave number [phone] to be reached with results. Will call her as soon as results are in and reviewed with Dr. Rip Harbour. Pt agreeable to plan.  Reviewed results with Dr. Rip Harbour. He recommends repeat ultrasound early next week. Ultrasound scheduled for 12/5 at 1245. Attempted to call patient and heard message that her voicemail is not set up. Will try again to call her later. Mychart message to patient. Pt returned call and left message with front desk with alternate number, called patient at that number and left voicemail informing her that I left a mychart message for her and if she has any other questions to call me back. Pt returned call, results given. Patient had no questions and will go to ultrasound next week.

## 2016-11-28 ENCOUNTER — Inpatient Hospital Stay (HOSPITAL_COMMUNITY): Payer: Medicaid Other

## 2016-11-28 ENCOUNTER — Encounter (HOSPITAL_COMMUNITY): Payer: Self-pay | Admitting: *Deleted

## 2016-11-28 ENCOUNTER — Inpatient Hospital Stay (HOSPITAL_COMMUNITY)
Admission: AD | Admit: 2016-11-28 | Discharge: 2016-11-28 | Disposition: A | Discharge status: Home / Self Care | Payer: Medicaid Other | Source: Ambulatory Visit | Attending: Family Medicine | Admitting: Family Medicine

## 2016-11-28 DIAGNOSIS — Z3A01 Less than 8 weeks gestation of pregnancy: Secondary | ICD-10-CM | POA: Diagnosis not present

## 2016-11-28 DIAGNOSIS — Z8249 Family history of ischemic heart disease and other diseases of the circulatory system: Secondary | ICD-10-CM | POA: Insufficient documentation

## 2016-11-28 DIAGNOSIS — R109 Unspecified abdominal pain: Secondary | ICD-10-CM | POA: Insufficient documentation

## 2016-11-28 DIAGNOSIS — Z833 Family history of diabetes mellitus: Secondary | ICD-10-CM | POA: Insufficient documentation

## 2016-11-28 DIAGNOSIS — O209 Hemorrhage in early pregnancy, unspecified: Secondary | ICD-10-CM | POA: Insufficient documentation

## 2016-11-28 DIAGNOSIS — F17210 Nicotine dependence, cigarettes, uncomplicated: Secondary | ICD-10-CM | POA: Insufficient documentation

## 2016-11-28 DIAGNOSIS — Z3490 Encounter for supervision of normal pregnancy, unspecified, unspecified trimester: Secondary | ICD-10-CM

## 2016-11-28 DIAGNOSIS — O26899 Other specified pregnancy related conditions, unspecified trimester: Secondary | ICD-10-CM

## 2016-11-28 DIAGNOSIS — O99331 Smoking (tobacco) complicating pregnancy, first trimester: Secondary | ICD-10-CM | POA: Diagnosis not present

## 2016-11-28 DIAGNOSIS — O3680X0 Pregnancy with inconclusive fetal viability, not applicable or unspecified: Secondary | ICD-10-CM

## 2016-11-28 DIAGNOSIS — Z8349 Family history of other endocrine, nutritional and metabolic diseases: Secondary | ICD-10-CM | POA: Diagnosis not present

## 2016-11-28 DIAGNOSIS — O26891 Other specified pregnancy related conditions, first trimester: Secondary | ICD-10-CM | POA: Insufficient documentation

## 2016-11-28 HISTORY — DX: Trichomoniasis, unspecified: A59.9

## 2016-11-28 LAB — URINALYSIS, ROUTINE W REFLEX MICROSCOPIC
BILIRUBIN URINE: NEGATIVE
GLUCOSE, UA: NEGATIVE mg/dL
Ketones, ur: NEGATIVE mg/dL
Leukocytes, UA: NEGATIVE
NITRITE: NEGATIVE
PH: 5.5 (ref 5.0–8.0)
Protein, ur: NEGATIVE mg/dL

## 2016-11-28 LAB — CBC
HCT: 38.5 % (ref 36.0–46.0)
HEMOGLOBIN: 13.6 g/dL (ref 12.0–15.0)
MCH: 31.6 pg (ref 26.0–34.0)
MCHC: 35.3 g/dL (ref 30.0–36.0)
MCV: 89.5 fL (ref 78.0–100.0)
PLATELETS: 221 10*3/uL (ref 150–400)
RBC: 4.3 MIL/uL (ref 3.87–5.11)
RDW: 12.2 % (ref 11.5–15.5)
WBC: 11.2 10*3/uL — ABNORMAL HIGH (ref 4.0–10.5)

## 2016-11-28 LAB — URINE MICROSCOPIC-ADD ON
BACTERIA UA: NONE SEEN
WBC, UA: NONE SEEN WBC/hpf (ref 0–5)

## 2016-11-28 LAB — HCG, QUANTITATIVE, PREGNANCY: hCG, Beta Chain, Quant, S: 2781 m[IU]/mL — ABNORMAL HIGH (ref <5)

## 2016-11-28 LAB — CREATININE, SERUM
Creatinine, Ser: 0.6 mg/dL (ref 0.44–1.00)
GFR calc Af Amer: >60 mL/min (ref >60)
GFR calc non Af Amer: >60 mL/min (ref >60)

## 2016-11-28 LAB — AST: AST: 16 U/L (ref 15–41)

## 2016-11-28 LAB — BUN: BUN: 10 mg/dL (ref 6–20)

## 2016-11-28 MED ORDER — METHOTREXATE INJECTION FOR WOMEN'S HOSPITAL
50.0000 mg/m2 | Freq: Once | INTRAMUSCULAR | Status: AC
  Administered 2016-11-28: 85 mg via INTRAMUSCULAR
  Filled 2016-11-28: qty 1.7

## 2016-11-28 NOTE — MAU Provider Note (Signed)
History     CSN: [id number]  Arrival date and time: 11/28/16 1206   First Provider Initiated Contact with Patient 11/28/16 1239      Chief Complaint  Patient presents with  . Abdominal Pain  . Vaginal Bleeding   HPI Jamie [name] is a 30 y.o. [id number] at [redacted]w[redacted]d by LMP who presents for abdominal cramping & vaginal bleeding. Patient seen at [facility] last week for f/u hcg d/t pregnancy of unknown location. Had rising bhcg but not doubling. F/u outpatient ultrasound ordered for viability tomorrow. Patient here today d/t bleeding & pain. Reports spotting last night that has increased in flow this morning. Is not saturating pads or passing clots. Also reports some lower abdominal cramping this morning that has since resolved.    OB History    Gravida Para Term Preterm AB Living   8 3 3  0 4 3   SAB TAB Ectopic Multiple Live Births   1 2 1   3       Past Medical History:  Diagnosis Date  . Abnormal Pap smear   . GERD (gastroesophageal reflux disease)   . HSV-2 (herpes simplex virus 2) infection   . Hx of chlamydia infection   . Hypertension   . Labial abscess   . Trichimoniasis 11/21/2016    Past Surgical History:  Procedure Laterality Date  . CESAREAN SECTION    . CESAREAN SECTION N/A 01/20/2014   Procedure: CESAREAN SECTION;  Surgeon: Emily Filbert, MD;  Location: [facility];  Service: Obstetrics;  Laterality: N/A;  . DILATION AND CURETTAGE OF UTERUS    . OOPHORECTOMY Right 01/20/2014   Procedure: OOPHORECTOMY;  Surgeon: Emily Filbert, MD;  Location: [facility];  Service: Obstetrics;  Laterality: Right;    Family History  Problem Relation Age of Onset  . Diabetes Mother   . Hypertension Mother   . Thyroid disease Mother   . Hypertension Father     Social History  Substance Use Topics  . Smoking status: Current Every Day Smoker    Packs/day: 0.50    Years: 3.00    Types: Cigarettes  . Smokeless tobacco: Never Used  . Alcohol use Yes     Comment: occasionally    Allergies:  No Known Allergies  Prescriptions Prior to Admission  Medication Sig Dispense Refill Last Dose  . acetaminophen (TYLENOL) 325 MG tablet Take 650 mg by mouth every 6 (six) hours as needed for moderate pain.   11/21/2016 at Unknown time  . HYDROcodone-acetaminophen (NORCO/VICODIN) 5-325 MG tablet Take 1 tablet by mouth every 6 (six) hours as needed for moderate pain.   11/21/2016 at Unknown time    Review of Systems  Constitutional: Negative for chills and fever.  Gastrointestinal: Positive for abdominal pain. Negative for constipation, diarrhea, nausea and vomiting.  Genitourinary: Negative for dysuria.       + vaginal bleeding   Physical Exam   Blood pressure 127/63, pulse 72, temperature 98.4 F (36.9 C), temperature source Oral, resp. rate 16, last menstrual period 10/24/2016, SpO2 100 %.  Physical Exam  Nursing note and vitals reviewed. Constitutional: She is oriented to person, place, and time. She appears well-developed and well-nourished. No distress.  HENT:  Head: Normocephalic and atraumatic.  Eyes: Conjunctivae are normal. Right eye exhibits no discharge. Left eye exhibits no discharge. No scleral icterus.  Neck: Normal range of motion.  Cardiovascular: Normal rate.   Respiratory: Effort normal. No respiratory distress.  GI: Soft. She exhibits no distension. There is  tenderness in the right lower quadrant, suprapubic area and left lower quadrant. There is no rebound and no guarding.  Neurological: She is alert and oriented to person, place, and time.  Skin: Skin is warm and dry. She is not diaphoretic.  Psychiatric: She has a normal mood and affect. Her behavior is normal. Judgment and thought content normal.    MAU Course  Procedures Results for orders placed or performed during the hospital encounter of 11/28/16 (from the past 24 hour(s))  Urinalysis, Routine w reflex microscopic (not at [facility])     Status: Abnormal   Collection Time: 11/28/16 12:21 PM  Result Value  Ref Range   Color, Urine AMBER (A) YELLOW   APPearance HAZY (A) CLEAR   Specific Gravity, Urine >1.030 (H) 1.005 - 1.030   pH 5.5 5.0 - 8.0   Glucose, UA NEGATIVE NEGATIVE mg/dL   Hgb urine dipstick LARGE (A) NEGATIVE   Bilirubin Urine NEGATIVE NEGATIVE   Ketones, ur NEGATIVE NEGATIVE mg/dL   Protein, ur NEGATIVE NEGATIVE mg/dL   Nitrite NEGATIVE NEGATIVE   Leukocytes, UA NEGATIVE NEGATIVE  Urine microscopic-add on     Status: Abnormal   Collection Time: 11/28/16 12:21 PM  Result Value Ref Range   Squamous Epithelial / LPF 0-5 (A) NONE SEEN   WBC, UA NONE SEEN 0 - 5 WBC/hpf   RBC / HPF 6-30 0 - 5 RBC/hpf   Bacteria, UA NONE SEEN NONE SEEN   Urine-Other MUCOUS PRESENT   CBC     Status: Abnormal   Collection Time: 11/28/16 12:48 PM  Result Value Ref Range   WBC 11.2 (H) 4.0 - 10.5 K/uL   RBC 4.30 3.87 - 5.11 MIL/uL   Hemoglobin 13.6 12.0 - 15.0 g/dL   HCT 38.5 36.0 - 46.0 %   MCV 89.5 78.0 - 100.0 fL   MCH 31.6 26.0 - 34.0 pg   MCHC 35.3 30.0 - 36.0 g/dL   RDW 12.2 11.5 - 15.5 %   Platelets 221 150 - 400 K/uL  hCG, quantitative, pregnancy     Status: Abnormal   Collection Time: 11/28/16 12:48 PM  Result Value Ref Range   hCG, Beta Chain, Quant, S 2,781 (H) <5 mIU/mL  AST     Status: None   Collection Time: 11/28/16 12:48 PM  Result Value Ref Range   AST 16 15 - 41 U/L  BUN     Status: None   Collection Time: 11/28/16 12:48 PM  Result Value Ref Range   BUN 10 6 - 20 mg/dL  Creatinine, serum     Status: None   Collection Time: 11/28/16 12:48 PM  Result Value Ref Range   Creatinine, Ser 0.60 0.44 - 1.00 mg/dL   GFR calc non Af Amer >60 >60 mL/min   GFR calc Af Amer >60 >60 mL/min   US Ob Transvaginal  Result Date: 11/28/2016 CLINICAL DATA:  30 year old pregnant female presents with abdominal cramping and spotting 1 day prior. Non localization of the pregnancy on obstetric scan from 1 week prior. Quantitative beta HCG 1,346 on 11/21/2016, 2,418 on 11/24/2016, and 2,781  on 11/28/2016. EDC by LMP: 07/31/2017, projecting to an expected gestational age of [redacted] weeks 0 days. EXAM: TRANSVAGINAL OB ULTRASOUND TECHNIQUE: Transvaginal ultrasound was performed for complete evaluation of the gestation as well as the maternal uterus, adnexal regions, and pelvic cul-de-sac. COMPARISON:  11/21/2016 obstetric scan. FINDINGS: The anteverted uterus measures 9.4 x 5.2 x 5.5 cm. No uterine fibroids are demonstrated. Cesarean scar is  seen in the anterior lower uterine segment. The bilayer endometrial thickness is 12 mm. No endometrial cavity fluid, intrauterine gestational sac or focal endometrial mass is demonstrated. Right ovary measures 2.8 x 1.9 x 2.6 cm. There is a 0.8 x 0.7 x 0.7 cm mildly thick walled cyst in the right ovary, which appears to be intra-ovarian on the provided cine sequences and per discussion with the ultrasound technologist. Left ovary measures 2.6 x 2.1 x 3.8 cm. There is a 1.4 x 1.3 x 1.8 cm left ovarian cyst with heterogeneous internal echoes and no internal vascularity, consistent with a hemorrhagic left ovarian cyst. No abnormal free fluid in the pelvis. IMPRESSION: Pregnancy is not definitely localized on this scan. No intrauterine gestational sac. Mildly thick walled 0.8 cm right ovarian cyst. Given the slowly rising serum beta HCG levels, the possibility of an occult ectopic pregnancy or a rare intra-ovarian ectopic gestation cannot be excluded. No free fluid in the pelvis. Follow-up serum beta HCG levels and short-term obstetric scan is recommended in 2-3 days or as clinically warranted. These results were called by telephone at the time of interpretation on 11/28/2016 at 2:38 pm to NP Jorje Guild , who verbally acknowledged these results. Electronically Signed   By: Ilona Sorrel M.D.   On: 11/28/2016 14:41    MDM BHCG & ultrasound Inappropriate rise in BHCG  Component     Latest Ref Rng & Units 11/21/2016 11/24/2016 11/28/2016  HCG, Beta Chain, Quant, S     <5  mIU/mL 1,346 (H) 2,418 (H) 2,781 (H)   Ultrasound shows no IUP; no free fluid Discussed results with Dr. Ilda Basset. Will offer methotrexate d/t concern for ectopic pregnancy.  VSS & hemoglobin stable; pt in no apparent distress.  Discussed results with patient & SO; pt agreeable to treatment Creatinine, BUN, & AST wnl Methotrexate given  Assessment and Plan  A: 1. Pregnancy of unknown anatomic location   2. Vaginal bleeding in pregnancy, first trimester   3. Abdominal cramping affecting pregnancy    P: Discharge home Strict return precautions; discussed s/s of ruptured ectopic  Go to [facility] Thursday at 11 am for day 4 BHCG Take tylenol prn pain D/c prenatal vitamins & NSAIDs  Jorje Guild 11/28/2016, 12:39 PM

## 2016-11-28 NOTE — MAU Note (Signed)
Patient c/o period-like lower abdominal cramping that she states as mild, but rates pain 5-6/10. Started having light pink spotting yesterday and getting a little darker today.  Still just spotting.  Has not had intercourse in past several days.  Scheduled [place] tomorrow.

## 2016-11-28 NOTE — Progress Notes (Signed)
RN went over pink sheet with patient regarding methotrexate.  Pt verbalizes understanding and denies having any questions.  Okay with receiving methotrexate.

## 2016-11-28 NOTE — Discharge Instructions (Signed)
Ectopic Pregnancy °An ectopic pregnancy is when the fertilized egg attaches (implants) outside the uterus. Most ectopic pregnancies occur in one of the tubes where eggs travel from the ovary to the uterus (fallopian tubes), but the implanting can occur in other locations. In rare cases, ectopic pregnancies occur on the ovary, intestine, pelvis, abdomen, or cervix. In an ectopic pregnancy, the fertilized egg does not have the ability to develop into a normal, healthy baby. °A ruptured ectopic pregnancy is one in which tearing or bursting of a fallopian tube causes internal bleeding. Often, there is intense lower abdominal pain, and vaginal bleeding sometimes occurs. Having an ectopic pregnancy can be life-threatening. If this dangerous condition is not treated, it can lead to blood loss, shock, or even death. °What are the causes? °The most common cause of this condition is damage to one of the fallopian tubes. A fallopian tube may be narrowed or blocked, and that keeps the fertilized egg from reaching the uterus. °What increases the risk? °This condition is more likely to develop in women of childbearing age who have different levels of risk. The levels of risk can be divided into three categories. °High risk  °· You have gone through infertility treatment. °· You have had an ectopic pregnancy before. °· You have had surgery on the fallopian tubes, or another surgical procedure, such as an abortion. °· You have had surgery to have the fallopian tubes tied (tubal ligation). °· You have problems or diseases of the fallopian tubes. °· You have been exposed to diethylstilbestrol (DES). This medicine was used until 1971, and it had effects on babies whose mothers took the medicine. °· You become pregnant while using an IUD (intrauterine device) for birth control. °Moderate risk  °· You have a history of infertility. °· You have had an STI (sexually transmitted infection). °· You have a history of pelvic inflammatory  disease (PID). °· You have scarring from endometriosis. °· You have multiple sexual partners. °· You smoke. °Low risk  °· You have had pelvic surgery. °· You use vaginal douches. °· You became sexually active before age 18. °What are the signs or symptoms? °Common symptoms of this condition include normal pregnancy symptoms, such as missing a period, nausea, tiredness, abdominal pain, breast tenderness, and bleeding. However, ectopic pregnancy will have additional symptoms, such as: °· Pain with intercourse. °· Irregular vaginal bleeding or spotting. °· Cramping or pain on one side or in the lower abdomen. °· Fast heartbeat, low blood pressure, and sweating. °· Passing out while having a bowel movement. °Symptoms of a ruptured ectopic pregnancy and internal bleeding may include: °· Sudden, severe pain in the abdomen and pelvis. °· Dizziness, weakness, light-headedness, or fainting. °· Pain in the shoulder or neck area. °How is this diagnosed? °This condition is diagnosed by: °· A pelvic exam to locate pain or a mass in the abdomen. °· A pregnancy test. This blood test checks for the presence as well as the specific level of pregnancy hormone in the bloodstream. °· Ultrasound. This is performed if a pregnancy test is positive. In this test, a probe is inserted into the vagina. The probe will detect a fetus, possibly in a location other than the uterus. °· Taking a sample of uterus tissue (dilation and curettage, or D&C). °· Surgery to perform a visual exam of the inside of the abdomen using a thin, lighted tube that has a tiny camera on the end (laparoscope). °· Culdocentesis. This procedure involves inserting a needle at the   vagina, behind the uterus. If blood is present in this area, it may indicate that a fallopian tube is torn. How is this treated? This condition is treated with medicine or surgery. Medicine  An injection of a medicine (methotrexate) may be given to cause the pregnancy tissue to be  absorbed. This medicine may save your fallopian tube. It may be given if:  The diagnosis is made early, with no signs of active bleeding.  The fallopian tube has not ruptured.  You are considered to be a good candidate for the medicine. Usually, pregnancy hormone blood levels are checked after methotrexate treatment. This is to be sure that the medicine is effective. It may take 4-6 weeks for the pregnancy to be absorbed. Most pregnancies will be absorbed by 3 weeks. Surgery  A laparoscope may be used to remove the pregnancy tissue.  If severe internal bleeding occurs, a larger cut (incision) may be made in the lower abdomen (laparotomy) to remove the fetus and placenta. This is done to stop the bleeding.  Part or all of the fallopian tube may be removed (salpingectomy) along with the fetus and placenta. The fallopian tube may also be repaired during the surgery.  In very rare circumstances, removal of the uterus (hysterectomy) may be required.  After surgery, pregnancy hormone testing may be done to be sure that there is no pregnancy tissue left. Whether your treatment is medicine or surgery, you may receive a Rho (D) immune globulin shot to prevent problems with any future pregnancy. This shot may be given if:  You are Rh-negative and the baby's father is Rh-positive.  You are Rh-negative and you do not know the Rh type of the baby's father. Follow these instructions at home:  Rest and limit your activity after the procedure for as long as told by your health care provider.  Until your health care provider says that it is safe:  Do not lift anything that is heavier than 10 lb (4.5 kg), or the limit that your health care provider tells you.  Avoid physical exercise and any movement that requires effort (is strenuous).  To help prevent constipation:  Eat a healthy diet that includes fruits, vegetables, and whole grains.  Drink 6-8 glasses of water per day. Get help right away  if:  You develop worsening pain that is not relieved by medicine.  You have:  A fever or chills.  Vaginal bleeding.  Redness and swelling at the incision site.  Nausea and vomiting.  You feel dizzy or weak.  You feel light-headed or you faint. This information is not intended to replace advice given to you by your health care provider. Make sure you discuss any questions you have with your health care provider. Document Released: 01/19/2005 Document Revised: 08/10/2016 Document Reviewed: 07/13/2016 Elsevier Interactive Patient Education  2017 [employer].    Methotrexate Treatment for an Ectopic Pregnancy Methotrexate is a medicine that treats ectopic pregnancy by stopping the growth of the fertilized egg. It also helps your body absorb tissue from the egg. This takes between 2 weeks and 6 weeks. Most ectopic pregnancies can be successfully treated with methotrexate if they are detected early enough. LET [facility] CARE PROVIDER KNOW ABOUT:  Any allergies you have.  All medicines you are taking, including vitamins, herbs, eye drops, creams, and over-the-counter medicines.  Medical conditions you have. RISKS AND COMPLICATIONS Generally, this is a safe treatment. However, as with any treatment, problems can occur. Possible problems or side effects include:  Nausea.  Vomiting.  Diarrhea.  Abdominal cramping.  Mouth sores.  Increased vaginal bleeding or spotting.   Swelling or irritation of the lining of your lungs (pneumonitis).  Failed treatment and continuation of the pregnancy.   Liver damage.  Hair loss. There is still a risk of the ectopic pregnancy rupturing while using the methotrexate. BEFORE THE PROCEDURE Before you take the medicine:   Liver tests, kidney tests, and a complete blood test are performed.  Blood tests are performed to measure the pregnancy hormone levels and to determine your blood type.  If you are Rh-negative and the father is  Rh-positive or his Rh type is not known, you will be given a Rho (D) immune globulin shot. PROCEDURE  There are two methods that your health care provider may use to prescribe methotrexate. One method involves a single dose or injection of the medicine. Another method involves a series of doses given through several injections.  AFTER THE PROCEDURE  You may have some abdominal cramping, vaginal bleeding, and fatigue in the first few days after taking methotrexate.  Blood tests will be taken for several weeks to check the pregnancy hormone levels. The blood tests are performed until there is no more pregnancy hormone detected in the blood. This information is not intended to replace advice given to you by your health care provider. Make sure you discuss any questions you have with your health care provider. Document Released: 12/06/2001 Document Revised: 01/02/2015 Document Reviewed: 09/30/2013 Elsevier Interactive Patient Education  2017 [employer].

## 2016-11-29 ENCOUNTER — Ambulatory Visit (HOSPITAL_COMMUNITY): Admission: RE | Admit: 2016-11-29 | Payer: Medicaid Other | Source: Ambulatory Visit

## 2016-11-29 ENCOUNTER — Telehealth: Payer: Self-pay | Admitting: Advanced Practice Midwife

## 2016-11-29 NOTE — Telephone Encounter (Signed)
Per ultrasound dept, pt has scheduled [place] today at 2 pm.  Chart reviewed and pt work up in [facility] including [place] and was given MTX yesterday, 10/29/16.  She was given instructions to f/u in [facility] on Day 4 and Day 7 for labs at that time.  Called pt today to confirm that her scheduled 2 pm [place] today is now cancelled but pt phone is not in service.

## 2016-12-01 ENCOUNTER — Ambulatory Visit: Payer: Medicaid Other

## 2016-12-01 ENCOUNTER — Telehealth: Payer: Self-pay | Admitting: General Practice

## 2016-12-01 NOTE — Telephone Encounter (Signed)
Patient missed appt for stat bhcg follow up for day 4 labs. Will send mychart message

## 2016-12-05 ENCOUNTER — Ambulatory Visit: Payer: Medicaid Other

## 2016-12-05 DIAGNOSIS — O3680X0 Pregnancy with inconclusive fetal viability, not applicable or unspecified: Secondary | ICD-10-CM

## 2016-12-05 NOTE — Patient Instructions (Signed)
Methotrexate injection What is this medicine? METHOTREXATE (METH oh TREX ate) is a chemotherapy drug used to treat cancer including breast cancer, leukemia, and lymphoma. This medicine can also be used to treat psoriasis and certain kinds of arthritis. This medicine may be used for other purposes; ask your health care provider or pharmacist if you have questions. What should I tell my health care provider before I take this medicine? They need to know if you have any of these conditions: -fluid in the stomach area or lungs -if you often drink alcohol -infection or immune system problems -kidney disease -liver disease -low blood counts, like low white cell, platelet, or red cell counts -lung disease -radiation therapy -stomach ulcers -ulcerative colitis -an unusual or allergic reaction to methotrexate, other medicines, foods, dyes, or preservatives -pregnant or trying to get pregnant -breast-feeding How should I use this medicine? This medicine is for infusion into a vein or for injection into muscle or into the spinal fluid (whichever applies). It is usually given by a health care professional in a hospital or clinic setting. In rare cases, you might get this medicine at home. You will be taught how to give this medicine. Use exactly as directed. Take your medicine at regular intervals. Do not take your medicine more often than directed. If this medicine is used for arthritis or psoriasis, it should be taken weekly, NOT daily. It is important that you put your used needles and syringes in a special sharps container. Do not put them in a trash can. If you do not have a sharps container, call your pharmacist or healthcare provider to get one. Talk to your pediatrician regarding the use of this medicine in children. While this drug may be prescribed for children as young as 2 years for selected conditions, precautions do apply. Overdosage: If you think you have taken too much of this medicine  contact a poison control center or emergency room at once. NOTE: This medicine is only for you. Do not share this medicine with others. What if I miss a dose? It is important not to miss your dose. Call your doctor or health care professional if you are unable to keep an appointment. If you give yourself the medicine and you miss a dose, talk with your doctor or health care professional. Do not take double or extra doses. What may interact with this medicine? This medicine may interact with the following medications: -acitretin -aspirin or aspirin-like medicines including salicylates -azathioprine -certain antibiotics like chloramphenicol, penicillin, tetracycline -certain medicines for stomach problems like esomeprazole, omeprazole, pantoprazole -cyclosporine -gold -hydroxychloroquine -live virus vaccines -mercaptopurine -NSAIDs, medicines for pain and inflammation, like ibuprofen or naproxen -other cytotoxic agents -penicillamine -phenylbutazone -phenytoin -probenacid -retinoids such as isotretinoin and tretinoin -steroid medicines like prednisone or cortisone -sulfonamides like sulfasalazine and trimethoprim/sulfamethoxazole -theophylline This list may not describe all possible interactions. Give your health care provider a list of all the medicines, herbs, non-prescription drugs, or dietary supplements you use. Also tell them if you smoke, drink alcohol, or use illegal drugs. Some items may interact with your medicine. What should I watch for while using this medicine? Avoid alcoholic drinks. In some cases, you may be given additional medicines to help with side effects. Follow all directions for their use. This medicine can make you more sensitive to the sun. Keep out of the sun. If you cannot avoid being in the sun, wear protective clothing and use sunscreen. Do not use sun lamps or tanning beds/booths. You may get drowsy   or dizzy. Do not drive, use machinery, or do anything that  needs mental alertness until you know how this medicine affects you. Do not stand or sit up quickly, especially if you are an older patient. This reduces the risk of dizzy or fainting spells. You may need blood work done while you are taking this medicine. Call your doctor or health care professional for advice if you get a fever, chills or sore throat, or other symptoms of a cold or flu. Do not treat yourself. This drug decreases your body's ability to fight infections. Try to avoid being around people who are sick. This medicine may increase your risk to bruise or bleed. Call your doctor or health care professional if you notice any unusual bleeding. Check with your doctor or health care professional if you get an attack of severe diarrhea, nausea and vomiting, or if you sweat a lot. The loss of too much body fluid can make it dangerous for you to take this medicine. Talk to your doctor about your risk of cancer. You may be more at risk for certain types of cancers if you take this medicine. Both men and women must use effective birth control with this medicine. Do not become pregnant while taking this medicine or until at least 1 normal menstrual cycle has occurred after stopping it. Women should inform their doctor if they wish to become pregnant or think they might be pregnant. Men should not father a child while taking this medicine and for 3 months after stopping it. There is a potential for serious side effects to an unborn child. Talk to your health care professional or pharmacist for more information. Do not breast-feed an infant while taking this medicine. What side effects may I notice from receiving this medicine? Side effects that you should report to your doctor or health care professional as soon as possible: -allergic reactions like skin rash, itching or hives, swelling of the face, lips, or tongue -back pain -breathing problems or shortness of breath -confusion -diarrhea -dry,  nonproductive cough -low blood counts - this medicine may decrease the number of [place] blood cells, red blood cells and platelets. You may be at increased risk of infections and bleeding -mouth sores -redness, blistering, peeling or loosening of the skin, including inside the mouth -seizures -severe headaches -signs of infection - fever or chills, cough, sore throat, pain or difficulty passing urine -signs and symptoms of bleeding such as bloody or black, tarry stools; red or dark-brown urine; spitting up blood or brown material that looks like coffee grounds; red spots on the skin; unusual bruising or bleeding from the eye, gums, or nose -signs and symptoms of kidney injury like trouble passing urine or change in the amount of urine -signs and symptoms of liver injury like dark yellow or brown urine; general ill feeling or flu-like symptoms; light-colored stools; loss of appetite; nausea; right upper belly pain; unusually weak or tired; yellowing of the eyes or skin -stiff neck -vomiting Side effects that usually do not require medical attention (report to your doctor or health care professional if they continue or are bothersome): -dizziness -hair loss -headache -stomach pain -upset stomach This list may not describe all possible side effects. Call your doctor for medical advice about side effects. You may report side effects to FDA at 1-800-FDA-1088. Where should I keep my medicine? If you are using this medicine at home, you will be instructed on how to store this medicine. Throw away any unused medicine after   the expiration date on the label. NOTE: This sheet is a summary. It may not cover all possible information. If you have questions about this medicine, talk to your doctor, pharmacist, or health care provider.  2017 Elsevier/Gold Standard (2015-04-02 12:36:41)

## 2016-12-06 ENCOUNTER — Telehealth: Payer: Self-pay

## 2016-12-06 LAB — HCG, QUANTITATIVE, PREGNANCY: hCG, Beta Chain, Quant, S: 2159.1 m[IU]/mL — ABNORMAL HIGH

## 2016-12-06 NOTE — Telephone Encounter (Signed)
-----   Message from Osborne Oman, MD sent at 12/06/2016  9:32 AM EST ----- 22% decrease (more than 15%) which is reassuring since date of methotrexate administration. Continue weekly HCG checks until negative, please call and schedule for patient.  Results were released to MyChart and patient was given recommendations as indicated.

## 2016-12-06 NOTE — Telephone Encounter (Signed)
Attempted to reach patient but number the patient to call and scheduled appointment for weekly hcg.

## 2016-12-13 ENCOUNTER — Encounter: Payer: Self-pay | Admitting: Obstetrics & Gynecology

## 2016-12-13 ENCOUNTER — Ambulatory Visit (INDEPENDENT_AMBULATORY_CARE_PROVIDER_SITE_OTHER): Payer: Medicaid Other | Admitting: Obstetrics & Gynecology

## 2016-12-13 VITALS — BP 120/80 | HR 80 | Wt 152.4 lb

## 2016-12-13 DIAGNOSIS — R309 Painful micturition, unspecified: Secondary | ICD-10-CM | POA: Diagnosis not present

## 2016-12-13 DIAGNOSIS — O00101 Right tubal pregnancy without intrauterine pregnancy: Secondary | ICD-10-CM

## 2016-12-13 LAB — POCT URINALYSIS DIPSTICK
GLUCOSE UA: NEGATIVE
Ketones, UA: NEGATIVE
Leukocytes, UA: NEGATIVE
NITRITE UA: NEGATIVE
PROTEIN UA: NEGATIVE
RBC UA: NEGATIVE

## 2016-12-13 NOTE — Progress Notes (Signed)
Chief Complaint  Patient presents with  . Miscarriage    went [facility]/ given medicine for std. c/o hurt when push urine out.    Blood pressure 120/80, pulse 80, weight 152 lb 6.4 oz (69.1 kg), last menstrual period 10/24/2016.  30 y.o. [id number] Patient's last menstrual period was 10/24/2016 (approximate). The current method of family planning is none.  Outpatient Encounter Prescriptions as of 12/13/2016  Medication Sig  . acetaminophen (TYLENOL) 325 MG tablet Take 650 mg by mouth every 6 (six) hours as needed for moderate pain.   No facility-administered encounter medications on file as of 12/13/2016.     Subjective Pt treated with methotrexate on 11/29/2016 hcg levels are dropping Will continue to follow them  Objective Abdomen soft benign  Pertinent ROS   Labs or studies     Impression Diagnoses this Encounter::   ICD-9-CM ICD-10-CM   1. Right tubal pregnancy without intrauterine pregnancy 633.10 O00.101 Beta HCG, Quant  2. Pain with urination 788.1 R30.9 POCT urinalysis dipstick    Established relevant diagnosis(es):   Plan/Recommendations: No orders of the defined types were placed in this encounter.   Labs or Scans Ordered: Orders Placed This Encounter  Procedures  . Beta HCG, Quant  . POCT urinalysis dipstick    Management:: Follow HCG levels to 0  Follow up Return if symptoms worsen or fail to improve.        Face to face time:  15 minutes  Greater than 50% of the visit time was spent in counseling and coordination of care with the patient.  The summary and outline of the counseling and care coordination is summarized in the note above.   All questions were answered.  Past Medical History:  Diagnosis Date  . Abnormal Pap smear   . GERD (gastroesophageal reflux disease)   . HSV-2 (herpes simplex virus 2) infection   . Hx of chlamydia infection   . Hypertension   . Labial abscess   . Trichimoniasis 11/21/2016    Past  Surgical History:  Procedure Laterality Date  . CESAREAN SECTION    . CESAREAN SECTION N/A 01/20/2014   Procedure: CESAREAN SECTION;  Surgeon: Emily Filbert, MD;  Location: [facility];  Service: Obstetrics;  Laterality: N/A;  . DILATION AND CURETTAGE OF UTERUS    . OOPHORECTOMY Right 01/20/2014   Procedure: OOPHORECTOMY;  Surgeon: Emily Filbert, MD;  Location: [facility];  Service: Obstetrics;  Laterality: Right;    OB History    Gravida Para Term Preterm AB Living   8 3 3  0 4 3   SAB TAB Ectopic Multiple Live Births   1 2 1   3       No Known Allergies  Social History   Social History  . Marital status: Single    Spouse name: N/A  . Number of children: N/A  . Years of education: N/A   Social History Main Topics  . Smoking status: Current Every Day Smoker    Packs/day: 0.50    Years: 3.00    Types: Cigarettes  . Smokeless tobacco: Never Used  . Alcohol use Yes     Comment: occasionally  . Drug use: No  . Sexual activity: Yes    Birth control/ protection: None   Other Topics Concern  . None   Social History Narrative  . None    Family History  Problem Relation Age of Onset  . Diabetes Mother   . Hypertension Mother   .  Thyroid disease Mother   . Hypertension Father

## 2016-12-14 LAB — BETA HCG QUANT (REF LAB): hCG Quant: 271 m[IU]/mL

## 2017-08-17 ENCOUNTER — Other Ambulatory Visit (HOSPITAL_COMMUNITY)
Admission: RE | Admit: 2017-08-17 | Discharge: 2017-08-17 | Disposition: A | Discharge status: Home / Self Care | Payer: Medicaid Other | Source: Ambulatory Visit | Attending: Obstetrics & Gynecology | Admitting: Obstetrics & Gynecology

## 2017-08-17 ENCOUNTER — Ambulatory Visit (INDEPENDENT_AMBULATORY_CARE_PROVIDER_SITE_OTHER): Payer: Medicaid Other | Admitting: Women's Health

## 2017-08-17 ENCOUNTER — Encounter: Payer: Self-pay | Admitting: Women's Health

## 2017-08-17 VITALS — BP 112/68 | HR 76 | Ht 62.0 in | Wt 151.0 lb

## 2017-08-17 DIAGNOSIS — Z3009 Encounter for other general counseling and advice on contraception: Secondary | ICD-10-CM

## 2017-08-17 DIAGNOSIS — Z01419 Encounter for gynecological examination (general) (routine) without abnormal findings: Secondary | ICD-10-CM

## 2017-08-17 DIAGNOSIS — Z309 Encounter for contraceptive management, unspecified: Secondary | ICD-10-CM | POA: Diagnosis not present

## 2017-08-17 DIAGNOSIS — R5383 Other fatigue: Secondary | ICD-10-CM

## 2017-08-17 DIAGNOSIS — N920 Excessive and frequent menstruation with regular cycle: Secondary | ICD-10-CM

## 2017-08-17 MED ORDER — NORETHIN-ETH ESTRAD-FE BIPHAS 1 MG-10 MCG / 10 MCG PO TABS: 1.0000 | ORAL_TABLET | Freq: Every day | ORAL | 3 refills | Status: DC

## 2017-08-17 NOTE — Patient Instructions (Addendum)
Condoms x2 weeks  For Headaches:   Stay well hydrated, drink enough water so that your urine is clear, sometimes if you are dehydrated you can get headaches  Eat small frequent meals and snacks, sometimes if you are hungry you can get headaches  Sometimes you get headaches during pregnancy from the pregnancy hormones  You can try tylenol (1-2 regular strength 325mg  or 1-2 extra strength 500mg ) as directed on the box. The least amount of medication that works is best.   Cool compresses (cool wet washcloth or ice pack) to area of head that is hurting  You can also try drinking a caffeinated drink to see if this will help  If not helping, try below:  For Prevention of Headaches/Migraines:  CoQ10 100mg  three times daily  Vitamin B2 400mg  daily  Magnesium Oxide 400-600mg  daily  If You Get a Bad Headache/Migraine:  Benadryl 25mg    Magnesium Oxide  1 large Gatorade  2 extra strength Tylenol (1,000mg  total)  1 cup coffee or Coke  If this doesn't help please call us @ [phone]    Oral Contraception Use Oral contraceptive pills (OCPs) are medicines taken to prevent pregnancy. OCPs work by preventing the ovaries from releasing eggs. The hormones in OCPs also cause the cervical mucus to thicken, preventing the sperm from entering the uterus. The hormones also cause the uterine lining to become thin, not allowing a fertilized egg to attach to the inside of the uterus. OCPs are highly effective when taken exactly as prescribed. However, OCPs do not prevent sexually transmitted diseases (STDs). Safe sex practices, such as using condoms along with an OCP, can help prevent STDs. Before taking OCPs, you may have a physical exam and Pap test. Your health care provider may also order blood tests if necessary. Your health care provider will make sure you are a good candidate for oral contraception. Discuss with your health care provider the possible side effects of the OCP you may be  prescribed. When starting an OCP, it can take 2 to 3 months for the body to adjust to the changes in hormone levels in your body. How to take oral contraceptive pills Your health care provider may advise you on how to start taking the first cycle of OCPs. Otherwise, you can:  Start on day 1 of your menstrual period. You will not need any backup contraceptive protection with this start time.  Start on the first Sunday after your menstrual period or the day you get your prescription. In these cases, you will need to use backup contraceptive protection for the first week.  Start the pill at any time of your cycle. If you take the pill within 5 days of the start of your period, you are protected against pregnancy right away. In this case, you will not need a backup form of birth control. If you start at any other time of your menstrual cycle, you will need to use another form of birth control for 7 days. If your OCP is the type called a minipill, it will protect you from pregnancy after taking it for 2 days (48 hours).  After you have started taking OCPs:  If you forget to take 1 pill, take it as soon as you remember. Take the next pill at the regular time.  If you miss 2 or more pills, call your health care provider because different pills have different instructions for missed doses. Use backup birth control until your next menstrual period starts.  If you use  a 28-day pack that contains inactive pills and you miss 1 of the last 7 pills (pills with no hormones), it will not matter. Throw away the rest of the non-hormone pills and start a new pill pack.  No matter which day you start the OCP, you will always start a new pack on that same day of the week. Have an extra pack of OCPs and a backup contraceptive method available in case you miss some pills or lose your OCP pack. Follow these instructions at home:  Do not smoke.  Always use a condom to protect against STDs. OCPs do not protect against  STDs.  Use a calendar to mark your menstrual period days.  Read the information and directions that came with your OCP. Talk to your health care provider if you have questions. Contact a health care provider if:  You develop nausea and vomiting.  You have abnormal vaginal discharge or bleeding.  You develop a rash.  You miss your menstrual period.  You are losing your hair.  You need treatment for mood swings or depression.  You get dizzy when taking the OCP.  You develop acne from taking the OCP.  You become pregnant. Get help right away if:  You develop chest pain.  You develop shortness of breath.  You have an uncontrolled or severe headache.  You develop numbness or slurred speech.  You develop visual problems.  You develop pain, redness, and swelling in the legs. This information is not intended to replace advice given to you by your health care provider. Make sure you discuss any questions you have with your health care provider. Document Released: 12/01/2011 Document Revised: 05/19/2016 Document Reviewed: 06/02/2013 Elsevier Interactive Patient Education  2017 Elsevier [employer]. Ethinyl Estradiol; Norethindrone Acetate tablets (contraception) What is this medicine? ETHINYL ESTRADIOL; NORETHINDRONE ACETATE (ETH in il es tra DYE ole; nor eth IN drone AS e tate) is an oral contraceptive. The products combine two types of female hormones, an estrogen and a progestin. They are used to prevent ovulation and pregnancy. This medicine may be used for other purposes; ask your health care provider or pharmacist if you have questions. COMMON BRAND NAME(S): Laddie Aquas 1.5/30, Junel 1/20, LARIN, Loestrin 1.5/30, Loestrin 1/20, Microgestin 1.5/30, Microgestin 1/20 What should I tell my health care provider before I take this medicine? They need to know if you have or ever had any of these conditions: -abnormal vaginal bleeding -blood vessel disease or blood clots -breast,  cervical, endometrial, ovarian, liver, or uterine cancer -diabetes -gallbladder disease -heart disease or recent heart attack -high blood pressure -high cholesterol -kidney disease -liver disease -migraine headaches -stroke -systemic lupus erythematosus (SLE) -tobacco smoker -an unusual or allergic reaction to estrogens, progestins, other medicines, foods, dyes, or preservatives -pregnant or trying to get pregnant -breast-feeding How should I use this medicine? Take this medicine by mouth. To reduce nausea, this medicine may be taken with food. Follow the directions on the prescription label. Take this medicine at the same time each day and in the order directed on the package. Do not take your medicine more often than directed. Contact your pediatrician regarding the use of this medicine in children. Special care may be needed. This medicine has been used in female children who have started having menstrual periods. A patient package insert for the product will be given with each prescription and refill. Read this sheet carefully each time. The sheet may change frequently. Overdosage: If you think you have taken too much of  this medicine contact a poison control center or emergency room at once. NOTE: This medicine is only for you. Do not share this medicine with others. What if I miss a dose? If you miss a dose, refer to the patient information sheet you received with your medicine for direction. If you miss more than one pill, this medicine may not be as effective and you may need to use another form of birth control. What may interact with this medicine? Do not take this medicine with the following medication: -dasabuvir; ombitasvir; paritaprevir; ritonavir -ombitasvir; paritaprevir; ritonavir This medicine may also interact with the following medications: -acetaminophen -antibiotics or medicines for infections, especially rifampin, rifabutin, rifapentine, and griseofulvin, and  possibly penicillins or tetracyclines -aprepitant -ascorbic acid (vitamin C) -atorvastatin -barbiturate medicines, such as phenobarbital -bosentan -carbamazepine -caffeine -clofibrate -cyclosporine -dantrolene -doxercalciferol -felbamate -grapefruit juice -hydrocortisone -medicines for anxiety or sleeping problems, such as diazepam or temazepam -medicines for diabetes, including pioglitazone -mineral oil -modafinil -mycophenolate -nefazodone -oxcarbazepine -phenytoin -prednisolone -ritonavir or other medicines for HIV infection or AIDS -rosuvastatin -selegiline -soy isoflavones supplements -St. John's wort -tamoxifen or raloxifene -theophylline -thyroid hormones -topiramate -warfarin This list may not describe all possible interactions. Give your health care provider a list of all the medicines, herbs, non-prescription drugs, or dietary supplements you use. Also tell them if you smoke, drink alcohol, or use illegal drugs. Some items may interact with your medicine. What should I watch for while using this medicine? Visit your doctor or health care professional for regular checks on your progress. You will need a regular breast and pelvic exam and Pap smear while on this medicine. Use an additional method of contraception during the first cycle that you take these tablets. If you have any reason to think you are pregnant, stop taking this medicine right away and contact your doctor or health care professional. If you are taking this medicine for hormone related problems, it may take several cycles of use to see improvement in your condition. Smoking increases the risk of getting a blood clot or having a stroke while you are taking birth control pills, especially if you are more than 30 years old. You are strongly advised not to smoke. This medicine can make your body retain fluid, making your fingers, hands, or ankles swell. Your blood pressure can go up. Contact your doctor  or health care professional if you feel you are retaining fluid. This medicine can make you more sensitive to the sun. Keep out of the sun. If you cannot avoid being in the sun, wear protective clothing and use sunscreen. Do not use sun lamps or tanning beds/booths. If you wear contact lenses and notice visual changes, or if the lenses begin to feel uncomfortable, consult your eye care specialist. In some women, tenderness, swelling, or minor bleeding of the gums may occur. Notify your dentist if this happens. Brushing and flossing your teeth regularly may help limit this. See your dentist regularly and inform your dentist of the medicines you are taking. If you are going to have elective surgery, you may need to stop taking this medicine before the surgery. Consult your health care professional for advice. This medicine does not protect you against HIV infection (AIDS) or any other sexually transmitted diseases. What side effects may I notice from receiving this medicine? Side effects that you should report to your doctor or health care professional as soon as possible: -breast tissue changes or discharge -changes in vaginal bleeding during your period or between your periods -  chest pain -coughing up blood -dizziness or fainting spells -headaches or migraines -leg, arm or groin pain -severe or sudden headaches -stomach pain (severe) -sudden shortness of breath -sudden loss of coordination, especially on one side of the body -speech problems -symptoms of vaginal infection like itching, irritation or unusual discharge -tenderness in the upper abdomen -vomiting -weakness or numbness in the arms or legs, especially on one side of the body -yellowing of the eyes or skin Side effects that usually do not require medical attention (report to your doctor or health care professional if they continue or are bothersome): -breakthrough bleeding and spotting that continues beyond the 3 initial cycles of  pills -breast tenderness -mood changes, anxiety, depression, frustration, anger, or emotional outbursts -increased sensitivity to sun or ultraviolet light -nausea -skin rash, acne, or brown spots on the skin -weight gain (slight) This list may not describe all possible side effects. Call your doctor for medical advice about side effects. You may report side effects to FDA at 1-800-FDA-1088. Where should I keep my medicine? Keep out of the reach of children. Store at room temperature between 15 and 30 degrees C (59 and 86 degrees F). Throw away any unused medicine after the expiration date. NOTE: This sheet is a summary. It may not cover all possible information. If you have questions about this medicine, talk to your doctor, pharmacist, or health care provider.  2018 Elsevier/Gold Standard (2016-08-22 08:02:50)

## 2017-08-17 NOTE — Progress Notes (Signed)
Subjective:   Jamie [name] is a 31 y.o. [phone] African American female here for a FP Mc[facility] routine well-woman exam. Originally presented for gyn visit, but hasn't had physical this year.  Patient's last menstrual period was 08/01/2017 (exact date).    Current complaints: heavy period this month, changed pad q 1hr, w/ fatigue- had to leave work- needs note- note given. Periods are regular, usually last 5d, this one lasted 7d. Happened once earlier this year. Wants to get back on birth control. Does not smoke, no h/o HTN, DVT/PE, CVA, MI, or migraines w/ aura. Has been having headaches.  PCP: none       Does want to do cbc to check hgb d/t sx- knows it will be out of pocket.    Social History: Sexual: heterosexual Marital Status: dating Living situation: with family Occupation: Scientist, water quality at [employer]: quit smoking, etoh occ Illicit drugs: no history of illicit drug use  The following portions of the patient's history were reviewed and updated as appropriate: allergies, current medications, past family history, past medical history, past social history, past surgical history and problem list.  Past Medical History Past Medical History:  Diagnosis Date  . Abnormal Pap smear   . GERD (gastroesophageal reflux disease)   . HSV-2 (herpes simplex virus 2) infection   . Hx of chlamydia infection   . Hypertension   . Labial abscess   . Trichimoniasis 11/21/2016    Past Surgical History Past Surgical History:  Procedure Laterality Date  . CESAREAN SECTION    . CESAREAN SECTION N/A 01/20/2014   Procedure: CESAREAN SECTION;  Surgeon: Emily Filbert, MD;  Location: [facility];  Service: Obstetrics;  Laterality: N/A;  . DILATION AND CURETTAGE OF UTERUS    . OOPHORECTOMY Right 01/20/2014   Procedure: OOPHORECTOMY;  Surgeon: Emily Filbert, MD;  Location: [facility];  Service: Obstetrics;  Laterality: Right;    Gynecologic History [id number]  Patient's last menstrual period was  08/01/2017 (exact date). Contraception: condoms Last Pap: 2013. Results were: LSIL/HPV Last mammogram: never. Results were: n/a Last TCS: never  Obstetric History OB History  Gravida Para Term Preterm AB Living  8 3 3  0 4 3  SAB TAB Ectopic Multiple Live Births  1 2 1   3     # Outcome Date GA Lbr Len/2nd Weight Sex Delivery Anes PTL Lv  8 Gravida           7 Term 01/21/14 [redacted]w[redacted]d  7 lb 3 oz (3.26 kg) M CS-LTranv Spinal  LIV  6 Term 2010 [redacted]w[redacted]d   F CS-LTranv Spinal N LIV     Birth Comments: RLTCD  5 Ectopic 2009          4 Term 2004 [redacted]w[redacted]d   M CS-Unspec Spinal N LIV     Birth Comments: cpd  3 TAB           2 SAB           1 TAB               Current Medications Current Outpatient Prescriptions on File Prior to Visit  Medication Sig Dispense Refill  . acetaminophen (TYLENOL) 325 MG tablet Take 650 mg by mouth every 6 (six) hours as needed for moderate pain.     No current facility-administered medications on file prior to visit.     Review of Systems Patient denies any headaches, blurred vision, shortness of breath, chest pain, abdominal pain, problems with bowel movements,  urination, or intercourse.  Objective:  BP 112/68 (BP Location: Right Arm, Patient Position: Sitting, Cuff Size: Normal)   Pulse 76   Ht 5\' 2"  (1.575 m)   Wt 151 lb (68.5 kg)   LMP 08/01/2017 (Exact Date)   Breastfeeding? Unknown   BMI 27.62 kg/m  Physical Exam  General:  Well developed, well nourished, no acute distress. She is alert and oriented x3. Skin:  Warm and dry Neck:  Midline trachea, no thyromegaly or nodules Cardiovascular: Regular rate and rhythm, no murmur heard Lungs:  Effort normal, all lung fields clear to auscultation bilaterally Breasts:  No dominant palpable mass, retraction, or nipple discharge Abdomen:  Soft, non tender, no hepatosplenomegaly or masses Pelvic:  External genitalia is normal in appearance.  The vagina is normal in appearance. The cervix is bulbous, no CMT.  Thin  prep pap is done w/ HR HPV cotesting. Uterus is felt to be normal size, shape, and contour.  No adnexal masses or tenderness noted. Extremities:  No swelling or varicosities noted Psych:  She has a normal mood and affect  Assessment:   Healthy FP Mcaid well-woman exam Heavy period x 1 Contraception management H/O abnormal pap  Plan:  GC/CT from pap, HIV, RPR per FP Mcaid, CBC-cancel if too much for pt to pay out of pocket Rx LoLoestrin 3pk w/ 3RF Gave work note to leave if heavy period/fatigue, but hopefully coc's will help F/U 37mths for coc f/u, or sooner if needed Mammogram @30yo  or sooner if problems Colonoscopy @30yo  or sooner if problems  Tawnya Crook CNM, [facility] 08/17/2017 9:49 AM

## 2017-08-18 LAB — HIV ANTIBODY (ROUTINE TESTING W REFLEX): HIV Screen 4th Generation wRfx: NONREACTIVE

## 2017-08-18 LAB — CBC
Hematocrit: 41 % (ref 34.0–46.6)
Hemoglobin: 14.1 g/dL (ref 11.1–15.9)
MCH: 30.6 pg (ref 26.6–33.0)
MCHC: 34.4 g/dL (ref 31.5–35.7)
MCV: 89 fL (ref 79–97)
PLATELETS: 200 10*3/uL (ref 150–379)
RBC: 4.61 x10E6/uL (ref 3.77–5.28)
RDW: 12 % — AB (ref 12.3–15.4)
WBC: 8.5 10*3/uL (ref 3.4–10.8)

## 2017-08-18 LAB — CYTOLOGY - PAP
CHLAMYDIA, DNA PROBE: NEGATIVE
Diagnosis: NEGATIVE
HPV (WINDOPATH): NOT DETECTED
Neisseria Gonorrhea: NEGATIVE

## 2017-08-18 LAB — RPR: RPR Ser Ql: NONREACTIVE

## 2017-11-20 ENCOUNTER — Ambulatory Visit: Payer: Medicaid Other | Admitting: Women's Health

## 2017-11-27 ENCOUNTER — Ambulatory Visit: Payer: Self-pay | Admitting: Women's Health

## 2017-12-16 IMAGING — US US OB TRANSVAGINAL
1 series · 15 of 28 positions shown · non-contrast
Comparison: 06/14/2015 CT

CLINICAL DATA: Right lower quadrant pain for 4 days. Quantitative
beta HCG is pending. Last menstrual period 10/24/2016

EXAM:
OBSTETRIC <14 WK US AND TRANSVAGINAL OB US
TECHNIQUE: Both transabdominal and transvaginal ultrasound examinations were
performed for complete evaluation of the gestation as well as the
maternal uterus, adnexal regions, and pelvic cul-de-sac.
Transvaginal technique was performed to assess early pregnancy.

[Series 1: us ob transvaginal · 15 of 60 slices shown]
[im 1/60]
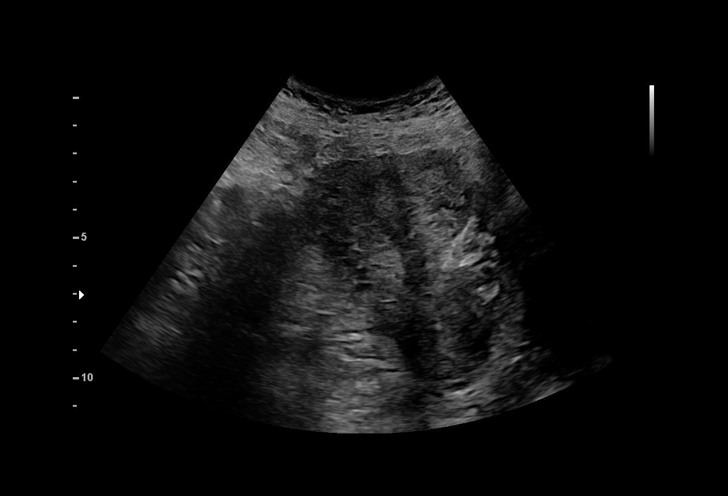
[im 5/60]
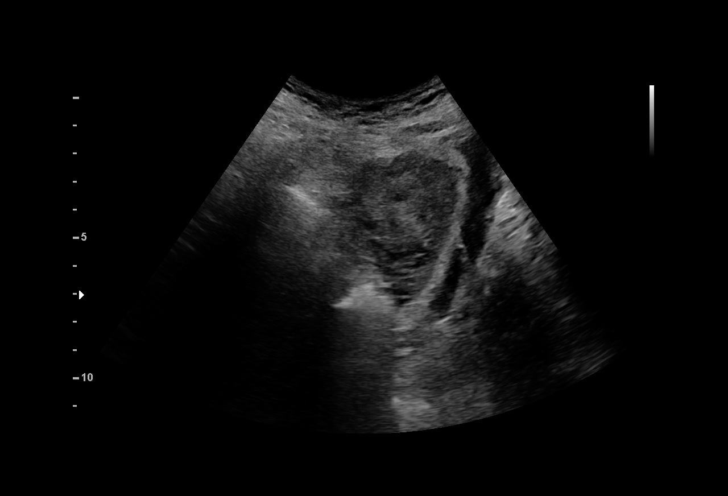
[im 9/60]
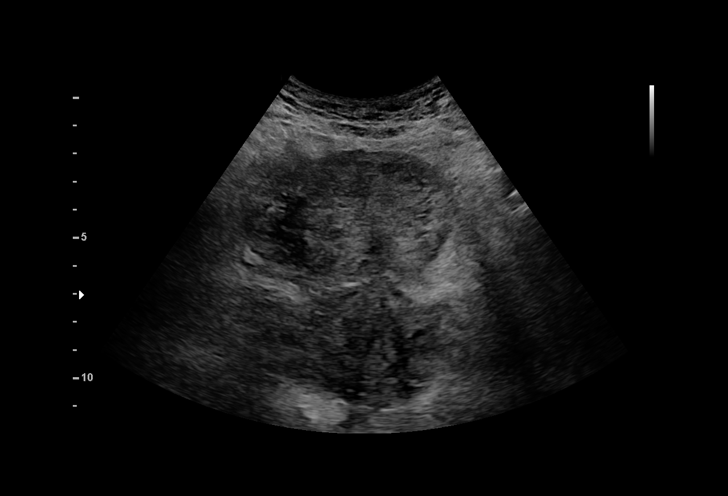
[im 14/60]
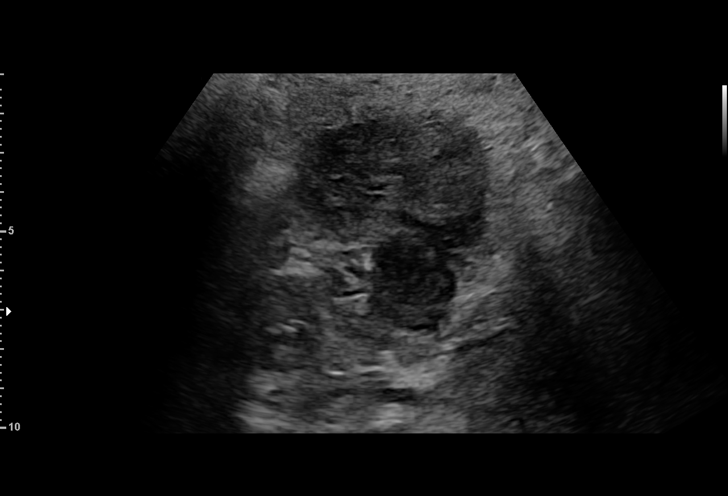
[im 18/60]
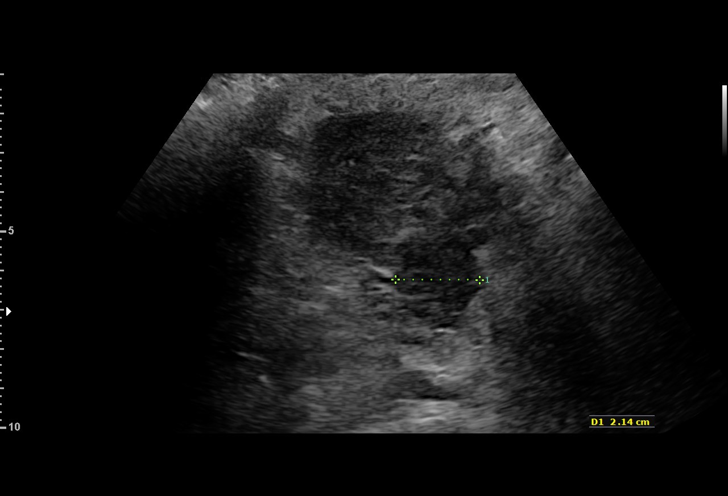
[im 22/60]
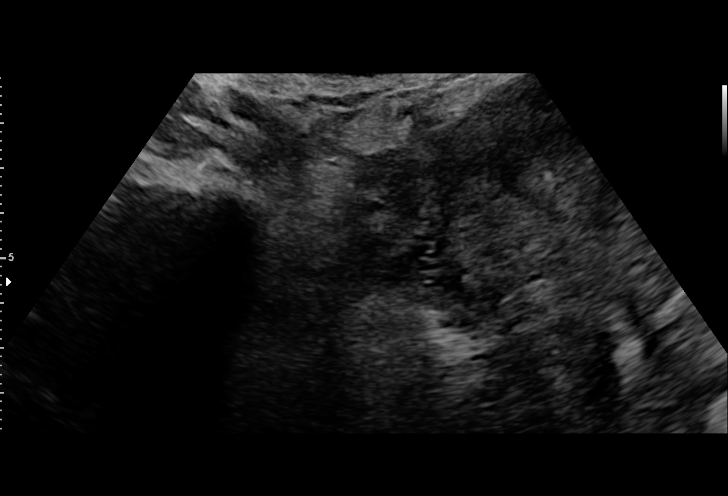
[im 27/60]
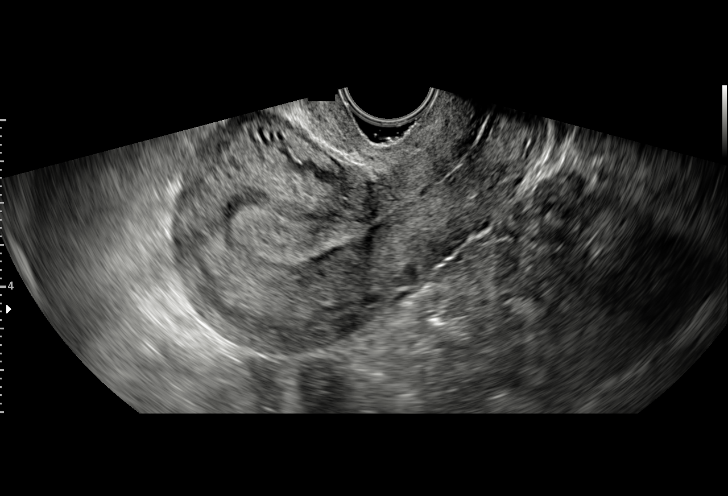
[im 31/60]
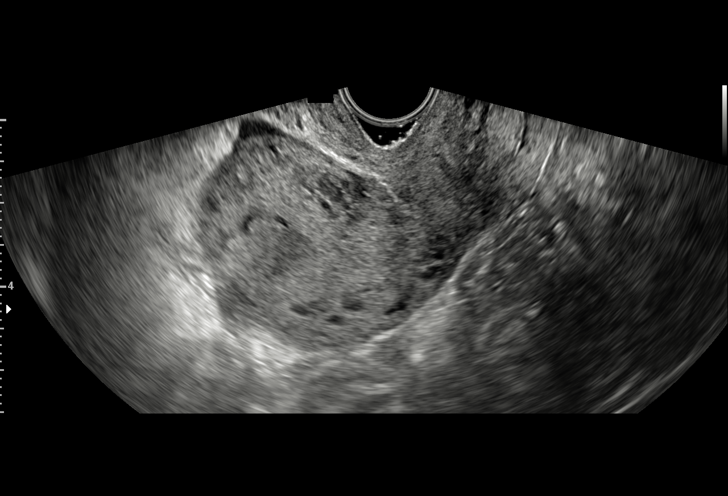
[im 33/60]
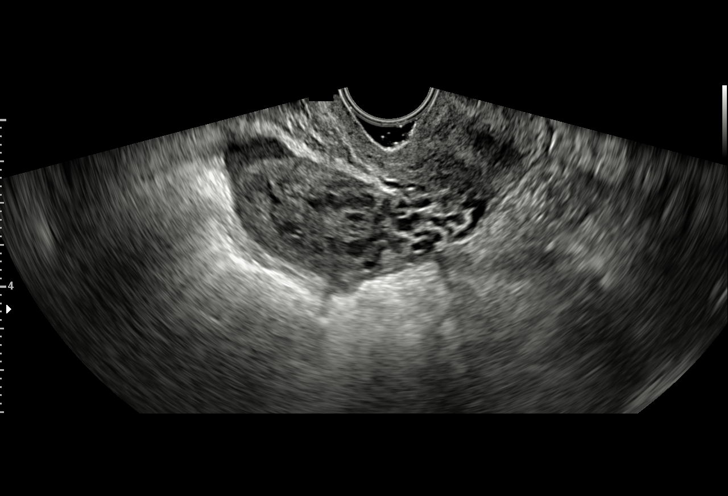
[im 38/60]
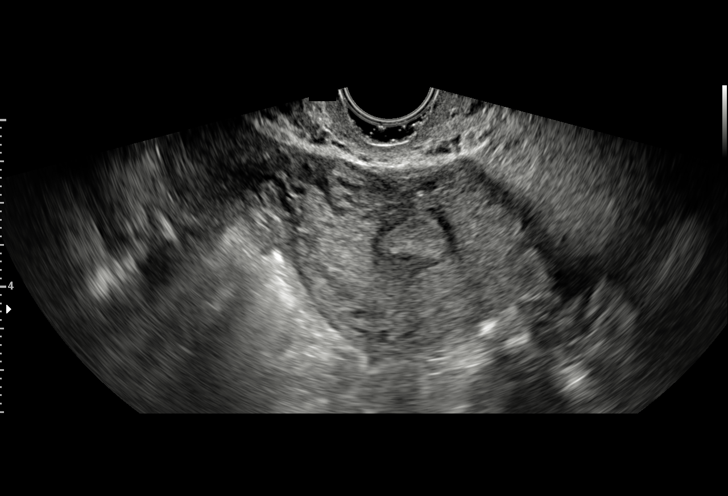
[im 42/60]
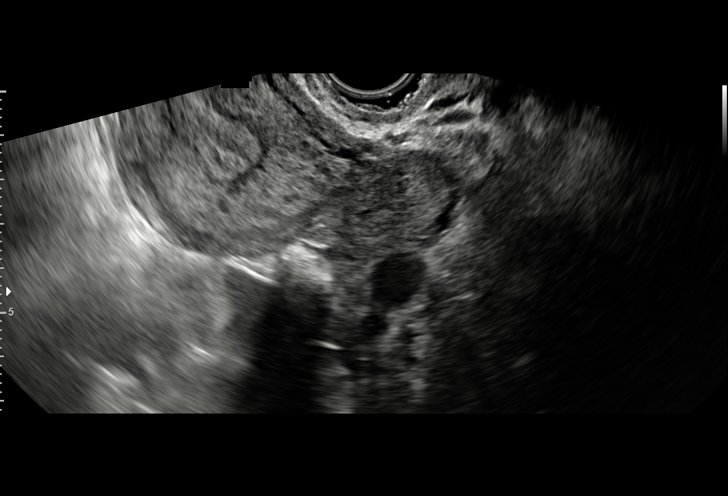
[im 46/60]
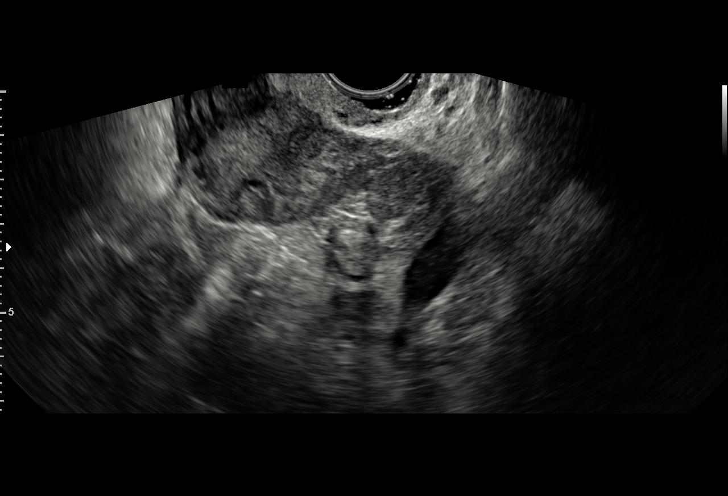
[im 51/60]
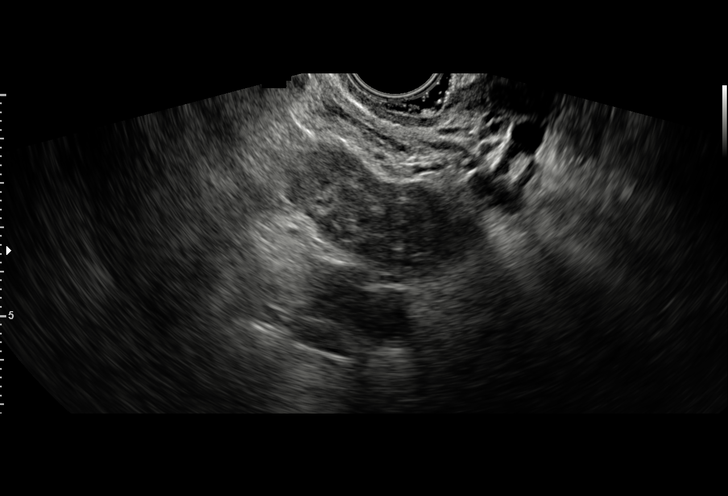
[im 55/60]
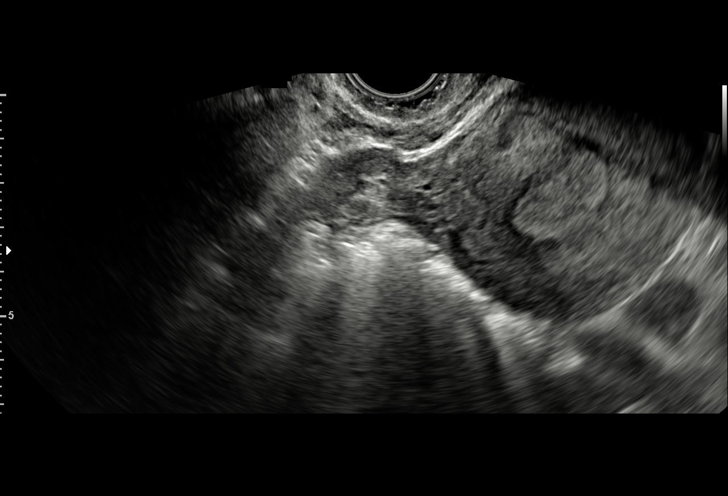
[im 60/60]
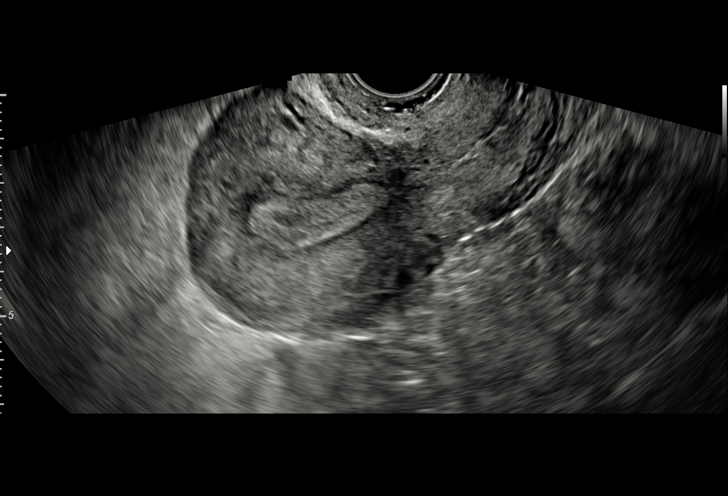

[15 of 28 positions shown; findings below may reference images not displayed]

FINDINGS: Intrauterine gestational sac: None

Yolk sac:  Not Visualized.

Embryo:  Not Visualized.

Cardiac Activity:  Not applicable

Maternal uterus/adnexae: The ovaries are unremarkable in appearance.
The right ovary measures 4 x 2 x 2.1 cm and the left measures 3 x
2.4 x 2.1 cm. Endometrial lining is thickened up to 16 mm. Trace
free complex fluid noted.
IMPRESSION: No intrauterine or ectopic pregnancy is identified.

## 2019-09-10 ENCOUNTER — Ambulatory Visit: Payer: Self-pay | Admitting: Family Medicine

## 2020-01-29 ENCOUNTER — Other Ambulatory Visit: Payer: Self-pay

## 2020-01-29 ENCOUNTER — Ambulatory Visit: Payer: BC Managed Care – PPO | Attending: Internal Medicine

## 2020-01-29 DIAGNOSIS — Z20822 Contact with and (suspected) exposure to covid-19: Secondary | ICD-10-CM

## 2020-01-31 LAB — NOVEL CORONAVIRUS, NAA: SARS-CoV-2, NAA: NOT DETECTED

## 2020-09-22 ENCOUNTER — Other Ambulatory Visit: Payer: Self-pay

## 2020-09-22 ENCOUNTER — Encounter: Payer: Self-pay | Admitting: Internal Medicine

## 2020-09-22 ENCOUNTER — Ambulatory Visit (INDEPENDENT_AMBULATORY_CARE_PROVIDER_SITE_OTHER): Payer: Medicaid Other | Admitting: Internal Medicine

## 2020-09-22 VITALS — BP 138/92 | HR 85 | Temp 97.9°F | Resp 16 | Ht 61.0 in | Wt 171.4 lb

## 2020-09-22 DIAGNOSIS — I10 Essential (primary) hypertension: Secondary | ICD-10-CM | POA: Diagnosis not present

## 2020-09-22 DIAGNOSIS — Z124 Encounter for screening for malignant neoplasm of cervix: Secondary | ICD-10-CM | POA: Diagnosis not present

## 2020-09-22 DIAGNOSIS — Z23 Encounter for immunization: Secondary | ICD-10-CM | POA: Diagnosis not present

## 2020-09-22 DIAGNOSIS — N761 Subacute and chronic vaginitis: Secondary | ICD-10-CM

## 2020-09-22 DIAGNOSIS — Z720 Tobacco use: Secondary | ICD-10-CM | POA: Insufficient documentation

## 2020-09-22 DIAGNOSIS — Z7689 Persons encountering health services in other specified circumstances: Secondary | ICD-10-CM | POA: Diagnosis not present

## 2020-09-22 DIAGNOSIS — D369 Benign neoplasm, unspecified site: Secondary | ICD-10-CM

## 2020-09-22 DIAGNOSIS — E669 Obesity, unspecified: Secondary | ICD-10-CM

## 2020-09-22 MED ORDER — FLUCONAZOLE 150 MG PO TABS: 150.0000 mg | ORAL_TABLET | Freq: Once | ORAL | 0 refills | Status: AC

## 2020-09-22 NOTE — Assessment & Plan Note (Signed)
C/o thick white discharge, dyspareunia Denies fever, chills H/o bacterial vaginosis and candidal vaginitis Will give 1 dose of Diflucan F/u US abdomen to r/o PID Has a follow up appointment with Ob./Gyn.

## 2020-09-22 NOTE — Assessment & Plan Note (Signed)
Smokes 1 cigar/day, quit for 6 years, recently started again  Asked about quitting: confirms that she currently smokes cigarettes Advise to quit smoking: Educated about QUITTING to reduce the risk of cancer, cardio and cerebrovascular disease. Assess willingness: willing to quit at this time Assist with counseling and pharmacotherapy: Counseled for 5 minutes and literature provided. Arrange for follow up: follow up and continue to offer help.

## 2020-09-22 NOTE — Progress Notes (Signed)
New Patient Office Visit  Subjective:  Patient ID: Jamie [name], female    DOB: 03-30-1986  Age: 34 y.o. MRN: [id number]  CC:  Chief Complaint  Patient presents with  . New Patient (Initial Visit)    new pt no former pcp has been having left side pain started out feeling like cramps but now has pressure.    HPI Jamie [name] is a 34 year old female with past medical history of ovarian teratoma status post removal in 2015 and tobacco abuse presents for establishing care.  Today, she complains of having take, white vaginal discharge for the last 3 months.  She also complains of dyspareunia.  She complains of left pelvic pain, which she describes as done, pressure-like, intermittent, nonradiating with no exacerbating or alleviating factors.  She also complains of bloating sensations occasionally, for which she takes over-the-counter medications.  She denies any fever, chills.  Of note, she has been having irregular menstrual cycles for the last 3 months, and has been noticing heavy bleeding than usual, noticing blood clots occasionally.  Patient used to use OCPs till early this year. She has started using condoms since then. Patient has history of 3 C-sections in the past and one ectopic pregnancy. Last Pap smear was in 07/2017.  Patient's BP was noted to be 145/94, followed by 138/92 on repeat check of BP.  Patient has a family history of hypertension.  Patient attributes lack of activity contributing to her blood pressure and wants to try diet and activity modification before starting treatment.  Patient had flu vaccine today in the office.  She has not had Covid vaccine yet.  Past Medical History:  Diagnosis Date  . Abnormal Pap smear   . GERD (gastroesophageal reflux disease)   . HSV-2 (herpes simplex virus 2) infection   . Hx of chlamydia infection   . Hypertension   . Labial abscess   . Trichimoniasis 11/21/2016    Past Surgical History:  Procedure Laterality Date  . CESAREAN  SECTION    . CESAREAN SECTION N/A 01/20/2014   Procedure: CESAREAN SECTION;  Surgeon: Emily Filbert, MD;  Location: [facility];  Service: Obstetrics;  Laterality: N/A;  . DILATION AND CURETTAGE OF UTERUS    . OOPHORECTOMY Right 01/20/2014   Procedure: OOPHORECTOMY;  Surgeon: Emily Filbert, MD;  Location: [facility];  Service: Obstetrics;  Laterality: Right;    Family History  Problem Relation Age of Onset  . Diabetes Mother   . Hypertension Mother   . Thyroid disease Mother   . Hypertension Father     Social History   Socioeconomic History  . Marital status: Married    Spouse name: Not on file  . Number of children: Not on file  . Years of education: Not on file  . Highest education level: Not on file  Occupational History  . Not on file  Tobacco Use  . Smoking status: Current Some Day Smoker    Packs/day: 0.50    Years: 3.00    Pack years: 1.50    Types: Cigars  . Smokeless tobacco: Never Used  Substance and Sexual Activity  . Alcohol use: Yes    Comment: occasionally  . Drug use: No  . Sexual activity: Yes    Birth control/protection: None, Condom  Other Topics Concern  . Not on file  Social History Narrative  . Not on file   Social Determinants of Health   Financial Resource Strain:   . Difficulty of Paying Living  Expenses: Not on file  Food Insecurity:   . Worried About Charity fundraiser in the Last Year: Not on file  . Ran Out of Food in the Last Year: Not on file  Transportation Needs:   . Lack of Transportation (Medical): Not on file  . Lack of Transportation (Non-Medical): Not on file  Physical Activity:   . Days of Exercise per Week: Not on file  . Minutes of Exercise per Session: Not on file  Stress:   . Feeling of Stress : Not on file  Social Connections:   . Frequency of Communication with Friends and Family: Not on file  . Frequency of Social Gatherings with Friends and Family: Not on file  . Attends Religious Services: Not on file  . Active Member of  Clubs or Organizations: Not on file  . Attends Archivist Meetings: Not on file  . Marital Status: Not on file  Intimate Partner Violence:   . Fear of Current or Ex-Partner: Not on file  . Emotionally Abused: Not on file  . Physically Abused: Not on file  . Sexually Abused: Not on file    ROS Review of Systems  Constitutional: Negative for chills and fever.  HENT: Negative for congestion, sinus pressure, sinus pain and sore throat.   Eyes: Negative for pain and discharge.  Respiratory: Negative for cough and shortness of breath.   Cardiovascular: Negative for chest pain and palpitations.  Gastrointestinal: Negative for abdominal pain, constipation, diarrhea, nausea and vomiting.  Endocrine: Negative for polydipsia and polyuria.  Genitourinary: Positive for menstrual problem, pelvic pain and vaginal discharge. Negative for dysuria and hematuria.  Musculoskeletal: Negative for neck pain and neck stiffness.  Skin: Negative for rash.  Neurological: Negative for dizziness and weakness.  Psychiatric/Behavioral: Negative for agitation and behavioral problems.    Objective:   Today's Vitals: BP (!) 138/92 (BP Location: Right Arm, Patient Position: Sitting)   Pulse 85   Temp 97.9 F (36.6 C) (Temporal)   Resp 16   Ht 5\' 1"  (1.549 m)   Wt 171 lb 6.4 oz (77.7 kg)   LMP 08/30/2020   SpO2 94%   BMI 32.39 kg/m   Physical Exam Vitals reviewed.  Constitutional:      General: She is not in acute distress.    Appearance: She is not diaphoretic.  HENT:     Head: Normocephalic and atraumatic.     Nose: Nose normal.     Mouth/Throat:     Mouth: Mucous membranes are moist.  Eyes:     General: No scleral icterus.    Extraocular Movements: Extraocular movements intact.     Pupils: Pupils are equal, round, and reactive to light.  Cardiovascular:     Rate and Rhythm: Normal rate and regular rhythm.     Pulses: Normal pulses.     Heart sounds: No murmur heard.   Pulmonary:       Breath sounds: Normal breath sounds. No wheezing or rales.  Abdominal:     Palpations: Abdomen is soft.     Tenderness: There is no abdominal tenderness.  Musculoskeletal:     Cervical back: Neck supple. No tenderness.     Right lower leg: No edema.     Left lower leg: No edema.  Skin:    General: Skin is warm.     Findings: No rash.  Neurological:     General: No focal deficit present.     Mental Status: She is alert and oriented  to person, place, and time.  Psychiatric:        Mood and Affect: Mood normal.        Behavior: Behavior normal.     Assessment & Plan:   Encounter to establish care Care established Previous chart reviewed History and medications reviewed with the patient   Tobacco abuse Smokes 1 cigar/day, quit for 6 years, recently started again  Asked about quitting: confirms that she currently smokes cigarettes Advise to quit smoking: Educated about QUITTING to reduce the risk of cancer, cardio and cerebrovascular disease. Assess willingness: willing to quit at this time Assist with counseling and pharmacotherapy: Counseled for 5 minutes and literature provided. Arrange for follow up: follow up and continue to offer help.   Vaginitis C/o thick white discharge, dyspareunia Denies fever, chills H/o bacterial vaginosis and candidal vaginitis Will give 1 dose of Diflucan F/u US abdomen to r/o PID Has a follow up appointment with Ob./Gyn.   Dermoid cyst S/p oophorectomy in 2015  Hypertension BP Readings from Last 3 Encounters:  09/22/20 (!) 138/92  08/17/17 112/68  12/13/16 120/80    Patient prefers to wait for now and try diet and activity modification. DASH diet advised, moderate exercise as tolerated advised Encourage to check BP at home and bring the log in next visit F/u BMP, lipid profile in next visit  Obesity (BMI 30.0-34.9) Diet modification and moderate exercise discussed  Flu vaccine administered today.  Advised to get COVID  vaccine ASAP. Addressed questions and concerns.  Provided referral for Ob./Gyn. for PAP testing.  Outpatient Encounter Medications as of 09/22/2020  Medication Sig  . acetaminophen (TYLENOL) 325 MG tablet Take 650 mg by mouth every 6 (six) hours as needed for moderate pain.  . fluconazole (DIFLUCAN) 150 MG tablet Take 1 tablet (150 mg total) by mouth once for 1 dose.  . [DISCONTINUED] Norethindrone-Ethinyl Estradiol-Fe Biphas (LO LOESTRIN FE) 1 MG-10 MCG / 10 MCG tablet Take 1 tablet by mouth daily.   No facility-administered encounter medications on file as of 09/22/2020.    Follow-up: Return in about 2 weeks (around 10/06/2020).   Lindell Spar, MD

## 2020-09-22 NOTE — Assessment & Plan Note (Signed)
S/p oophorectomy in 2015

## 2020-09-22 NOTE — Assessment & Plan Note (Signed)
Diet modification and moderate exercise discussed 

## 2020-09-22 NOTE — Assessment & Plan Note (Signed)
BP Readings from Last 3 Encounters:  09/22/20 (!) 138/92  08/17/17 112/68  12/13/16 120/80    Patient prefers to wait for now and try diet and activity modification. DASH diet advised, moderate exercise as tolerated advised Encourage to check BP at home and bring the log in next visit F/u BMP, lipid profile in next visit

## 2020-09-22 NOTE — Patient Instructions (Addendum)
Please follow up with Ob./Gyn. for PAP testing.  You are being scheduled for US pelvis for left pelvic pain. Please get blood tests done before next visit.  Okay to take over-the-counter medicines for bloating Jamie [name]). Okay to take up to 3 tablets of Tylenol in a day as needed for pain.  Please check BP at home and bring the log in the next visit.  Please follow DASH diet and moderate exercise as tolerated.  DASH stands for Dietary Approaches to Stop Hypertension. The DASH diet is a healthy-eating plan designed to help treat or prevent high blood pressure (hypertension).  The DASH diet includes foods that are rich in potassium, calcium and magnesium. These nutrients help control blood pressure. The diet limits foods that are high in sodium, saturated fat and added sugars.  Studies have shown that the DASH diet can lower blood pressure in as little as two weeks. The diet can also lower low-density lipoprotein (LDL or "bad") cholesterol levels in the blood. High blood pressure and high LDL cholesterol levels are two major risk factors for heart disease and stroke.    DASH diet: Recommended servings The DASH diet provides daily and weekly nutritional goals. The number of servings you should have depends on your daily calorie needs.  Here's a look at the recommended servings from each food group for a 2,000-calorie-a-day DASH diet:  Grains: 6 to 8 servings a day. One serving is one slice bread, 1 ounce dry cereal, or 1/2 cup cooked cereal, rice or pasta. Vegetables: 4 to 5 servings a day. One serving is 1 cup raw leafy green vegetable, 1/2 cup cut-up raw or cooked vegetables, or 1/2 cup vegetable juice. Fruits: 4 to 5 servings a day. One serving is one medium fruit, 1/2 cup fresh, frozen or canned fruit, or 1/2 cup fruit juice. Fat-free or low-fat dairy products: 2 to 3 servings a day. One serving is 1 cup milk or yogurt, or 1 1/2 ounces cheese. Lean meats, poultry and fish: six 1-ounce  servings or fewer a day. One serving is 1 ounce cooked meat, poultry or fish, or 1 egg. Nuts, seeds and legumes: 4 to 5 servings a week. One serving is 1/3 cup nuts, 2 tablespoons peanut butter, 2 tablespoons seeds, or 1/2 cup cooked legumes (dried beans or peas). Fats and oils: 2 to 3 servings a day. One serving is 1 teaspoon soft margarine, 1 teaspoon vegetable oil, 1 tablespoon mayonnaise or 2 tablespoons salad dressing. Sweets and added sugars: 5 servings or fewer a week. One serving is 1 tablespoon sugar, jelly or jam, 1/2 cup sorbet, or 1 cup lemonade.

## 2020-09-22 NOTE — Assessment & Plan Note (Signed)
Care established Previous chart reviewed History and medications reviewed with the patient 

## 2020-10-06 ENCOUNTER — Ambulatory Visit: Payer: Medicaid Other | Admitting: Internal Medicine

## 2020-10-12 ENCOUNTER — Telehealth: Payer: Self-pay | Admitting: *Deleted

## 2020-10-12 NOTE — Telephone Encounter (Signed)
FYI  Called pt to verify ins as precert was needed for ultrasound and [employer] said not a member. Let her know she would need to contact the ins company and bring new card by so this could be precerted and it was going to be cancelled until we received this.

## 2020-10-21 ENCOUNTER — Other Ambulatory Visit (HOSPITAL_COMMUNITY)
Admission: RE | Admit: 2020-10-21 | Discharge: 2020-10-21 | Disposition: A | Discharge status: Home / Self Care | Payer: Medicaid Other | Source: Ambulatory Visit | Attending: Adult Health | Admitting: Adult Health

## 2020-10-21 ENCOUNTER — Other Ambulatory Visit: Payer: Self-pay

## 2020-10-21 ENCOUNTER — Encounter: Payer: Self-pay | Admitting: Adult Health

## 2020-10-21 ENCOUNTER — Ambulatory Visit (INDEPENDENT_AMBULATORY_CARE_PROVIDER_SITE_OTHER): Payer: Medicaid Other | Admitting: Adult Health

## 2020-10-21 VITALS — BP 123/81 | HR 67 | Ht 61.4 in | Wt 169.2 lb

## 2020-10-21 DIAGNOSIS — Z319 Encounter for procreative management, unspecified: Secondary | ICD-10-CM | POA: Diagnosis not present

## 2020-10-21 DIAGNOSIS — Z0000 Encounter for general adult medical examination without abnormal findings: Secondary | ICD-10-CM | POA: Insufficient documentation

## 2020-10-21 DIAGNOSIS — R1032 Left lower quadrant pain: Secondary | ICD-10-CM | POA: Insufficient documentation

## 2020-10-21 DIAGNOSIS — Z01419 Encounter for gynecological examination (general) (routine) without abnormal findings: Secondary | ICD-10-CM | POA: Diagnosis not present

## 2020-10-21 MED ORDER — PRENATAL PLUS 27-1 MG PO TABS: 1.0000 | ORAL_TABLET | Freq: Every day | ORAL | 12 refills | Status: DC

## 2020-10-21 NOTE — Progress Notes (Signed)
Patient ID: Jamie [name], female   DOB: [birth year], 34 y.o.   MRN: [id number] History of Present Illness: Jamie [name] is a 34 year old black female, married, [id number] in for pelvic and pap, and she requests breast exam. She has had LLQ pain and has [place] in am at [facility]. She wants to get pregnant. She had ectopic in 2018 and took methotrexate.  PCP is Dr Posey Pronto.   Current Medications, Allergies, Past Medical History, Past Surgical History, Family History and Social History were reviewed in [employer] record.     Review of Systems: Patient denies any headaches, hearing loss, fatigue, blurred vision, shortness of breath, chest pain,  problems with bowel movements, urination, or intercourse. No joint pain or mood swings. See HPI for positives.   Physical Exam:BP 123/81 (BP Location: Left Arm, Patient Position: Sitting, Cuff Size: Normal)   Pulse 67   Ht 5' 1.4" (1.56 m)   Wt 169 lb 3.2 oz (76.7 kg)   LMP 09/28/2020   BMI 31.55 kg/m  General:  Well developed, well nourished, no acute distress Skin:  Warm and dry Neck:  Midline trachea, normal thyroid, good ROM, no lymphadenopathy Lungs; Clear to auscultation bilaterally Breast:  No dominant palpable mass, retraction, or nipple discharge Cardiovascular: Regular rate and rhythm Pelvic:  External genitalia is normal in appearance, no lesions.  The vagina is normal in appearance. Urethra has no lesions or masses. The cervix is smooth, pap with GC/CHL and high risk HPV genotyping performed.  Uterus is felt to be normal size, shape, and contour.  No adnexal masses or tenderness noted.Bladder is non tender, no masses felt. Psych:  No mood changes, alert and cooperative,seems happy AA is 2 Fall risk is low PHQ 9 score is 8, no SI, declines meds  [facility] - 10/21/20 0946      Pregnancy Intention Screening   Does the patient want to become pregnant in the next year? Yes    Does the patient's partner want to become pregnant in the  next year? Yes    Would the patient like to discuss contraceptive options today? N/A      Contraception Wrap Up   Current Method Pregnant/Seeking Pregnancy    End Method Pregnant/Seeking Pregnancy    Contraception Counseling Provided No         Examination chaperoned by Levy Pupa LPN  Impression and Plan: 1. Routine medical exam  2. Encounter for gynecological examination with Papanicolaou smear of cervix Pap sent Physical with PCP next year Pap in 3 if normal Mammogram at 41 Labs with PCP   3. Patient desires pregnancy Will rx PNV Meds ordered this encounter  Medications  . prenatal vitamin w/FE, FA (PRENATAL 1 + 1) 27-1 MG TABS tablet    Sig: Take 1 tablet by mouth daily at 12 noon.    Dispense:  30 tablet    Refill:  12    Order Specific Question:   Supervising Provider    Answer:   Florian Buff [[id number]]  Discussed timing of sex, and could take 6-18 months of active trying    4. LLQ pain Has [place] in am, and follow up with Dr Posey Pronto.

## 2020-10-22 ENCOUNTER — Other Ambulatory Visit: Payer: Medicaid Other

## 2020-10-22 ENCOUNTER — Ambulatory Visit (HOSPITAL_COMMUNITY)
Admission: RE | Admit: 2020-10-22 | Discharge: 2020-10-22 | Disposition: A | Discharge status: Home / Self Care | Payer: Medicaid Other | Source: Ambulatory Visit | Attending: Internal Medicine | Admitting: Internal Medicine

## 2020-10-22 ENCOUNTER — Other Ambulatory Visit: Payer: Self-pay | Admitting: *Deleted

## 2020-10-22 DIAGNOSIS — Z20822 Contact with and (suspected) exposure to covid-19: Secondary | ICD-10-CM

## 2020-10-22 DIAGNOSIS — D369 Benign neoplasm, unspecified site: Secondary | ICD-10-CM | POA: Diagnosis not present

## 2020-10-22 DIAGNOSIS — R1032 Left lower quadrant pain: Secondary | ICD-10-CM | POA: Diagnosis not present

## 2020-10-22 DIAGNOSIS — N926 Irregular menstruation, unspecified: Secondary | ICD-10-CM | POA: Diagnosis not present

## 2020-10-23 ENCOUNTER — Other Ambulatory Visit: Payer: Self-pay | Admitting: Adult Health

## 2020-10-23 ENCOUNTER — Encounter: Payer: Self-pay | Admitting: Adult Health

## 2020-10-23 DIAGNOSIS — A599 Trichomoniasis, unspecified: Secondary | ICD-10-CM | POA: Insufficient documentation

## 2020-10-23 LAB — CYTOLOGY - PAP
Chlamydia: NEGATIVE
Comment: NEGATIVE
Comment: NEGATIVE
Comment: NORMAL
Diagnosis: NEGATIVE
High risk HPV: NEGATIVE
Neisseria Gonorrhea: NEGATIVE

## 2020-10-23 LAB — SARS-COV-2, NAA 2 DAY TAT

## 2020-10-23 LAB — NOVEL CORONAVIRUS, NAA: SARS-CoV-2, NAA: DETECTED — AB

## 2020-10-23 MED ORDER — METRONIDAZOLE 500 MG PO TABS: 500.0000 mg | ORAL_TABLET | Freq: Two times a day (BID) | ORAL | 0 refills | Status: DC

## 2020-10-23 NOTE — Progress Notes (Signed)
+  trich on pap rx flagyl

## 2020-10-24 ENCOUNTER — Telehealth: Payer: Self-pay | Admitting: Physician Assistant

## 2020-10-24 NOTE — Telephone Encounter (Signed)
Called to Discuss with patient about Covid symptoms and the use of the monoclonal antibody infusion for those with mild to moderate Covid symptoms and at a high risk of hospitalization.     Pt appears to qualify for this infusion due to co-morbid conditions and/or a member of an at-risk group in accordance with the FDA Emergency Use Authorization.    I was able to speak with the patient and explain MAB. She would like to think about it and call us back. She qualifies with high SVI and BMI. Husband is pending test. MyChart information sent.

## 2020-12-22 ENCOUNTER — Other Ambulatory Visit: Payer: Self-pay

## 2020-12-22 ENCOUNTER — Other Ambulatory Visit: Payer: Medicaid Other

## 2020-12-22 DIAGNOSIS — Z20822 Contact with and (suspected) exposure to covid-19: Secondary | ICD-10-CM

## 2020-12-24 LAB — SPECIMEN STATUS REPORT

## 2020-12-24 LAB — SARS-COV-2, NAA 2 DAY TAT

## 2020-12-24 LAB — NOVEL CORONAVIRUS, NAA: SARS-CoV-2, NAA: NOT DETECTED

## 2021-01-27 ENCOUNTER — Ambulatory Visit (INDEPENDENT_AMBULATORY_CARE_PROVIDER_SITE_OTHER): Payer: Medicaid Other | Admitting: *Deleted

## 2021-01-27 ENCOUNTER — Other Ambulatory Visit: Payer: Self-pay

## 2021-01-27 VITALS — BP 135/89 | HR 86 | Wt 168.0 lb

## 2021-01-27 DIAGNOSIS — Z3201 Encounter for pregnancy test, result positive: Secondary | ICD-10-CM

## 2021-01-27 LAB — POCT URINE PREGNANCY: Preg Test, Ur: POSITIVE — AB

## 2021-01-27 MED ORDER — PROMETHAZINE HCL 25 MG PO TABS: 25.0000 mg | ORAL_TABLET | Freq: Four times a day (QID) | ORAL | 1 refills | Status: DC | PRN

## 2021-01-27 NOTE — Progress Notes (Signed)
   NURSE VISIT- PREGNANCY CONFIRMATION   SUBJECTIVE:  Jamie [name] is a 35 y.o. [phone] female at [redacted]w[redacted]d by certain LMP of Patient's last menstrual period was 12/22/2020 (exact date). Here for pregnancy confirmation.  Home pregnancy test: positive x 3  She reports nausea.  She is taking prenatal vitamins.    OBJECTIVE:  LMP 12/22/2020 (Exact Date)   Appears well, in no apparent distress OB History  Gravida Para Term Preterm AB Living  9 3 3  0 4 3  SAB IAB Ectopic Multiple Live Births  1 2 1   3     # Outcome Date GA Lbr Len/2nd Weight Sex Delivery Anes PTL Lv  9 Current           8 Term 01/21/14 [redacted]w[redacted]d  7 lb 3 oz (3.26 kg) M CS-LTranv Spinal  LIV  7 Term 2010 [redacted]w[redacted]d   F CS-LTranv Spinal N LIV     Birth Comments: RLTCD  6 Ectopic 2009          5 Term 2004 [redacted]w[redacted]d   M CS-Unspec Spinal N LIV     Birth Comments: cpd  4 Gravida           3 IAB           2 SAB           1 IAB             Results for orders placed or performed in visit on 01/27/21 (from the past 24 hour(s))  POCT urine pregnancy   Collection Time: 01/27/21  4:35 PM  Result Value Ref Range   Preg Test, Ur Positive (A) Negative    ASSESSMENT: Positive pregnancy test, [redacted]w[redacted]d by LMP    PLAN: Schedule for dating ultrasound in 2 weeks Prenatal vitamins: continue   Nausea medicines: requested-note routed to Derrek Monaco to send prescription   OB packet given: Yes  Jamie [name]  01/27/2021 4:36 PM

## 2021-01-27 NOTE — Progress Notes (Signed)
Chart reviewed for nurse visit. Agree with plan of care. Will rx phenergan  Estill Dooms, NP 01/27/2021 5:04 PM

## 2021-01-27 NOTE — Addendum Note (Signed)
Addended by: Derrek Monaco A on: 01/27/2021 05:05 PM   Modules accepted: Orders

## 2021-02-05 ENCOUNTER — Encounter (HOSPITAL_COMMUNITY): Payer: Self-pay

## 2021-02-05 ENCOUNTER — Inpatient Hospital Stay (HOSPITAL_COMMUNITY)
Admission: AD | Admit: 2021-02-05 | Discharge: 2021-02-05 | Disposition: A | Discharge status: Home / Self Care | Payer: Medicaid Other | Attending: Obstetrics and Gynecology | Admitting: Obstetrics and Gynecology

## 2021-02-05 ENCOUNTER — Other Ambulatory Visit: Payer: Self-pay

## 2021-02-05 ENCOUNTER — Inpatient Hospital Stay (HOSPITAL_COMMUNITY): Payer: Medicaid Other

## 2021-02-05 DIAGNOSIS — R1031 Right lower quadrant pain: Secondary | ICD-10-CM | POA: Diagnosis not present

## 2021-02-05 DIAGNOSIS — R109 Unspecified abdominal pain: Secondary | ICD-10-CM

## 2021-02-05 DIAGNOSIS — Z87891 Personal history of nicotine dependence: Secondary | ICD-10-CM | POA: Diagnosis not present

## 2021-02-05 DIAGNOSIS — O26899 Other specified pregnancy related conditions, unspecified trimester: Secondary | ICD-10-CM

## 2021-02-05 DIAGNOSIS — O26891 Other specified pregnancy related conditions, first trimester: Secondary | ICD-10-CM | POA: Diagnosis not present

## 2021-02-05 DIAGNOSIS — O209 Hemorrhage in early pregnancy, unspecified: Secondary | ICD-10-CM | POA: Diagnosis not present

## 2021-02-05 DIAGNOSIS — Z79899 Other long term (current) drug therapy: Secondary | ICD-10-CM | POA: Diagnosis not present

## 2021-02-05 DIAGNOSIS — Z3A00 Weeks of gestation of pregnancy not specified: Secondary | ICD-10-CM | POA: Diagnosis not present

## 2021-02-05 DIAGNOSIS — O4691 Antepartum hemorrhage, unspecified, first trimester: Secondary | ICD-10-CM

## 2021-02-05 DIAGNOSIS — Z3A01 Less than 8 weeks gestation of pregnancy: Secondary | ICD-10-CM

## 2021-02-05 DIAGNOSIS — O3680X0 Pregnancy with inconclusive fetal viability, not applicable or unspecified: Secondary | ICD-10-CM | POA: Diagnosis not present

## 2021-02-05 LAB — URINALYSIS, ROUTINE W REFLEX MICROSCOPIC
Bilirubin Urine: NEGATIVE
Glucose, UA: NEGATIVE mg/dL
Ketones, ur: 20 mg/dL — AB
Leukocytes,Ua: NEGATIVE
Nitrite: NEGATIVE
Protein, ur: 100 mg/dL — AB
RBC / HPF: >50 RBC/hpf — ABNORMAL HIGH (ref 0–5)
Specific Gravity, Urine: 1.031 — ABNORMAL HIGH (ref 1.005–1.030)
pH: 5 (ref 5.0–8.0)

## 2021-02-05 LAB — CBC
HCT: 40.9 % (ref 36.0–46.0)
Hemoglobin: 13.8 g/dL (ref 12.0–15.0)
MCH: 30.6 pg (ref 26.0–34.0)
MCHC: 33.7 g/dL (ref 30.0–36.0)
MCV: 90.7 fL (ref 80.0–100.0)
Platelets: 232 10*3/uL (ref 150–400)
RBC: 4.51 MIL/uL (ref 3.87–5.11)
RDW: 11.9 % (ref 11.5–15.5)
WBC: 13 10*3/uL — ABNORMAL HIGH (ref 4.0–10.5)
nRBC: 0 % (ref 0.0–0.2)

## 2021-02-05 LAB — COMPREHENSIVE METABOLIC PANEL
ALT: 24 U/L (ref 0–44)
AST: 22 U/L (ref 15–41)
Albumin: 4.2 g/dL (ref 3.5–5.0)
Alkaline Phosphatase: 60 U/L (ref 38–126)
Anion gap: 12 (ref 5–15)
BUN: 8 mg/dL (ref 6–20)
CO2: 24 mmol/L (ref 22–32)
Calcium: 9.1 mg/dL (ref 8.9–10.3)
Chloride: 102 mmol/L (ref 98–111)
Creatinine, Ser: 0.66 mg/dL (ref 0.44–1.00)
GFR, Estimated: >60 mL/min (ref >60)
Glucose, Bld: 96 mg/dL (ref 70–99)
Potassium: 3.2 mmol/L — ABNORMAL LOW (ref 3.5–5.1)
Sodium: 138 mmol/L (ref 135–145)
Total Bilirubin: 0.5 mg/dL (ref 0.3–1.2)
Total Protein: 7.2 g/dL (ref 6.5–8.1)

## 2021-02-05 LAB — WET PREP, GENITAL
Clue Cells Wet Prep HPF POC: NONE SEEN
Sperm: NONE SEEN
Trich, Wet Prep: NONE SEEN
Yeast Wet Prep HPF POC: NONE SEEN

## 2021-02-05 LAB — HCG, QUANTITATIVE, PREGNANCY: hCG, Beta Chain, Quant, S: 446 m[IU]/mL — ABNORMAL HIGH (ref <5)

## 2021-02-05 MED ORDER — HYDROMORPHONE HCL 1 MG/ML IJ SOLN
0.5000 mg | Freq: Once | INTRAMUSCULAR | Status: AC
  Administered 2021-02-05: 0.5 mg via INTRAMUSCULAR
  Filled 2021-02-05: qty 1

## 2021-02-05 NOTE — MAU Provider Note (Signed)
History     CSN: [id number]  Arrival date and time: 02/05/21 1657  Event Date/Time  First Provider Initiated Contact with Patient 02/05/21 1746     Chief Complaint  Patient presents with  . Abdominal Pain  . Vaginal Bleeding   HPI Jamie [name] is a 35 y.o. [phone] at [redacted]w[redacted]d who presents to MAU with chief complaint of abdominal cramping. This is a new problem, onset today. Her pain is generalized across her lower abdomen. Pain score 7/10. She has not taken medication or tried other treatments for this complaint. She denies aggravating or alleviating factors.  Patient also reports vaginal bleeding, onset yesterday. She reports passing small clots. She has not donned a pad but has seen some level of bleeding each time she wipes and in her underwear since onset. She denies SOB, weakness, syncope.  Patient endorses LMP 12/22/2020.   OB History    Gravida  9   Para  3   Term  3   Preterm  0   AB  5   Living  3     SAB  2   IAB  2   Ectopic  1   Multiple      Live Births  3           Past Medical History:  Diagnosis Date  . Abnormal Pap smear   . GERD (gastroesophageal reflux disease)   . HSV-2 (herpes simplex virus 2) infection   . Hx of chlamydia infection   . Hypertension   . Labial abscess   . Trichimoniasis 11/21/2016  . Vaginal Pap smear, abnormal     Past Surgical History:  Procedure Laterality Date  . CESAREAN SECTION    . CESAREAN SECTION N/A 01/20/2014   Procedure: CESAREAN SECTION;  Surgeon: Emily Filbert, MD;  Location: [facility];  Service: Obstetrics;  Laterality: N/A;  . DILATION AND CURETTAGE OF UTERUS    . OOPHORECTOMY Right 01/20/2014   Procedure: OOPHORECTOMY;  Surgeon: Emily Filbert, MD;  Location: [facility];  Service: Obstetrics;  Laterality: Right;    Family History  Problem Relation Age of Onset  . Diabetes Mother   . Hypertension Mother   . Thyroid disease Mother   . Hypertension Father     Social History   Tobacco Use  .  Smoking status: Former Smoker    Packs/day: 0.50    Years: 3.00    Pack years: 1.50    Types: Cigars, Cigarettes    Quit date: 01/17/2021    Years since quitting: 0.0  . Smokeless tobacco: Never Used  Vaping Use  . Vaping Use: Never used  Substance Use Topics  . Alcohol use: Not Currently    Comment: occasionally  . Drug use: No    Allergies: No Known Allergies  Medications Prior to Admission  Medication Sig Dispense Refill Last Dose  . prenatal vitamin w/FE, FA (PRENATAL 1 + 1) 27-1 MG TABS tablet Take 1 tablet by mouth daily at 12 noon. 30 tablet 12 02/05/2021 at Unknown time  . promethazine (PHENERGAN) 25 MG tablet Take 1 tablet (25 mg total) by mouth every 6 (six) hours as needed for nausea or vomiting. 30 tablet 1 Past Month at Unknown time  . acetaminophen (TYLENOL) 325 MG tablet Take 650 mg by mouth every 6 (six) hours as needed for moderate pain.       Review of Systems  Gastrointestinal: Positive for abdominal pain.  Genitourinary: Positive for vaginal bleeding.  All other  systems reviewed and are negative.  Physical Exam   Blood pressure 125/76, pulse 95, temperature 99.2 F (37.3 C), temperature source Oral, resp. rate 16, height 5\' 1"  (1.549 m), weight 76 kg, last menstrual period 12/22/2020, SpO2 99 %, unknown if currently breastfeeding.  Physical Exam Vitals and nursing note reviewed. Exam conducted with a chaperone present.  Constitutional:      Appearance: She is well-developed.  Cardiovascular:     Rate and Rhythm: Normal rate.     Heart sounds: Normal heart sounds.  Pulmonary:     Effort: Pulmonary effort is normal.     Breath sounds: Normal breath sounds.  Abdominal:     General: Bowel sounds are normal.     Palpations: Abdomen is soft.     Tenderness: There is no abdominal tenderness. There is no right CVA tenderness or left CVA tenderness.  Genitourinary:    Vagina: Bleeding present.     Cervix: Normal.     Comments: Pelvic exam: External  genitalia normal, vaginal walls pink and well rugated, cervix visually closed, no lesions noted. Moderate amount of dark red bleeding. Removed with faux swab x 4. No overt, active, bright red bleeding observed  Skin:    Capillary Refill: Capillary refill takes less than 2 seconds.  Neurological:     Mental Status: She is alert and oriented to person, place, and time.  Psychiatric:        Mood and Affect: Mood normal.        Behavior: Behavior normal.     MAU Course  Procedures  Orders Placed This Encounter  Procedures  . Wet prep, genital    Standing Status:   Standing    Number of Occurrences:   1  . US OB LESS THAN 14 WEEKS WITH OB TRANSVAGINAL    Quant hCG ordered    Standing Status:   Standing    Number of Occurrences:   1    Order Specific Question:   Symptom/Reason for Exam    Answer:   Abdominal cramping affecting pregnancy [[id number]]  . Urinalysis, Routine w reflex microscopic Urine, Clean Catch    Standing Status:   Standing    Number of Occurrences:   1  . CBC    Standing Status:   Standing    Number of Occurrences:   1  . hCG, quantitative, pregnancy    Standing Status:   Standing    Number of Occurrences:   1  . Comprehensive metabolic panel    Standing Status:   Standing    Number of Occurrences:   1  . Diet NPO time specified    Standing Status:   Standing    Number of Occurrences:   1   Patient Vitals for the past 24 hrs:  BP Temp Temp src Pulse Resp SpO2 Height Weight  02/05/21 2025 (!) 141/79 98.9 F (37.2 C) Oral 99 16 99 % -- --  02/05/21 1718 125/76 99.2 F (37.3 C) Oral 95 16 99 % -- --  02/05/21 1713 -- -- -- -- -- -- 5\' 1"  (1.549 m) 76 kg   Results for orders placed or performed during the hospital encounter of 02/05/21 (from the past 24 hour(s))  Urinalysis, Routine w reflex microscopic Urine, Clean Catch     Status: Abnormal   Collection Time: 02/05/21  5:00 PM  Result Value Ref Range   Color, Urine AMBER (A) YELLOW   APPearance CLOUDY  (A) CLEAR   Specific Gravity, Urine 1.031 (  H) 1.005 - 1.030   pH 5.0 5.0 - 8.0   Glucose, UA NEGATIVE NEGATIVE mg/dL   Hgb urine dipstick LARGE (A) NEGATIVE   Bilirubin Urine NEGATIVE NEGATIVE   Ketones, ur 20 (A) NEGATIVE mg/dL   Protein, ur 100 (A) NEGATIVE mg/dL   Nitrite NEGATIVE NEGATIVE   Leukocytes,Ua NEGATIVE NEGATIVE   RBC / HPF >50 (H) 0 - 5 RBC/hpf   WBC, UA 6-10 0 - 5 WBC/hpf   Bacteria, UA RARE (A) NONE SEEN   Squamous Epithelial / LPF 0-5 0 - 5   Mucus PRESENT    Ca Oxalate Crys, UA PRESENT   CBC     Status: Abnormal   Collection Time: 02/05/21  5:36 PM  Result Value Ref Range   WBC 13.0 (H) 4.0 - 10.5 K/uL   RBC 4.51 3.87 - 5.11 MIL/uL   Hemoglobin 13.8 12.0 - 15.0 g/dL   HCT 40.9 36.0 - 46.0 %   MCV 90.7 80.0 - 100.0 fL   MCH 30.6 26.0 - 34.0 pg   MCHC 33.7 30.0 - 36.0 g/dL   RDW 11.9 11.5 - 15.5 %   Platelets 232 150 - 400 K/uL   nRBC 0.0 0.0 - 0.2 %  hCG, quantitative, pregnancy     Status: Abnormal   Collection Time: 02/05/21  5:36 PM  Result Value Ref Range   hCG, Beta Chain, Quant, S 446 (H) <5 mIU/mL  Comprehensive metabolic panel     Status: Abnormal   Collection Time: 02/05/21  5:36 PM  Result Value Ref Range   Sodium 138 135 - 145 mmol/L   Potassium 3.2 (L) 3.5 - 5.1 mmol/L   Chloride 102 98 - 111 mmol/L   CO2 24 22 - 32 mmol/L   Glucose, Bld 96 70 - 99 mg/dL   BUN 8 6 - 20 mg/dL   Creatinine, Ser 0.66 0.44 - 1.00 mg/dL   Calcium 9.1 8.9 - 10.3 mg/dL   Total Protein 7.2 6.5 - 8.1 g/dL   Albumin 4.2 3.5 - 5.0 g/dL   AST 22 15 - 41 U/L   ALT 24 0 - 44 U/L   Alkaline Phosphatase 60 38 - 126 U/L   Total Bilirubin 0.5 0.3 - 1.2 mg/dL   GFR, Estimated >60 >60 mL/min   Anion gap 12 5 - 15  Wet prep, genital     Status: Abnormal   Collection Time: 02/05/21  6:11 PM   Specimen: PATH Cytology Cervicovaginal Ancillary Only  Result Value Ref Range   Yeast Wet Prep HPF POC NONE SEEN NONE SEEN   Trich, Wet Prep NONE SEEN NONE SEEN   Clue Cells Wet  Prep HPF POC NONE SEEN NONE SEEN   WBC, Wet Prep HPF POC MODERATE (A) NONE SEEN   Sperm NONE SEEN    US OB LESS THAN 14 WEEKS WITH OB TRANSVAGINAL  Result Date: 02/05/2021 CLINICAL DATA:  Cramping in the right lower quadrant 1 day, quantitative beta hCG in process EXAM: OBSTETRIC <14 WK [place] AND TRANSVAGINAL OB US TECHNIQUE: Both transabdominal and transvaginal ultrasound examinations were performed for complete evaluation of the gestation as well as the maternal uterus, adnexal regions, and pelvic cul-de-sac. Transvaginal technique was performed to assess early pregnancy. COMPARISON:  Pelvic ultrasound 10/22/2020 FINDINGS: Intrauterine gestational sac: None Yolk sac:  Not Visualized. Embryo:  Not Visualized. Cardiac Activity: Not Visualized. Maternal uterus/adnexae: Anteverted maternal uterus. Tiny rounded heterogeneous focus measuring 6 x 6 x 9 mm in size along the anterior  uterine fundus/body, likely reflects a small fibroid. No other concerning uterine findings on abnormality. Normal appearance of the right ovary. Simple appearing 3.2 x 2.7 x 3.4 cm cyst in the left ovary with some peripheral color flow is favored to reflect a dominant follicle. No other focal adnexal abnormalities. No free fluid. IMPRESSION: No intrauterine pregnancy visualized. Differential considerations would include early intrauterine pregnancy too early to visualize, spontaneous abortion, or occult ectopic pregnancy. Recommend close clinical followup and serial quantitative beta HCGs and ultrasounds. Simple appearing 3.4 cm cystic structure in the left ovary without internal complexity favored to reflect dominant follicle. Electronically Signed   By: Lovena Le M.D.   On: 02/05/2021 19:00   Assessment and Plan  --35 y.o. [id number] with PUL --Discussed early pregnancy vs concern for miscarriage in progress --Quant hCG 446 --Hgb 13.8 --Blood type B POS --Signs of worsening acuity, indications for immediate return to MAU reviewed  with patient and sister --Discharge home in stable condition with bleeding precautions  F/U: --Return to MAU Sunday after 5:30pm for repeat stat Quant hCG  Darlina Rumpf, CNM 02/05/2021, 9:13 PM

## 2021-02-05 NOTE — Discharge Instructions (Signed)
Miscarriage A miscarriage is the loss of a pregnancy before the 20th week of pregnancy. Sometimes, a pregnancy ends before a woman knows that she is pregnant. If you lose a pregnancy, talk with your doctor about:  Questions you have about the loss of your baby.  How to work through your grief.  Plans for future pregnancy. What are the causes? Many times, the cause of this condition is not known. What increases the risk? These things may make a pregnant woman more likely to lose a pregnancy: Certain health conditions  Conditions that affect hormones, such as: ? Thyroid disease. ? Polycystic ovary syndrome.  Diabetes.  A disease that causes the body's disease-fighting system to attack itself by mistake.  Infections.  Bleeding problems.  Being very overweight. Lifestyle factors  Using products that have tobacco or nicotine in them.  Being around tobacco smoke.  Having alcohol.  Having a lot of caffeine.  Using drugs. Problems with reproductive organs or parts  Having a cervix that opens and thins before your due date. The cervix is the lowest part of your womb.  Having Asherman syndrome, which leads to: ? Scars in the womb. ? The womb being abnormal in shape.  Growths (fibroids) in the womb.  Problems in the body that are present at birth.  Infection of the cervix or womb. Personal or health history  Injury.  Having lost a pregnancy before.  Being younger than age 18 or older than age 35.  Being around a harmful substance, such as radiation.  Having lead or other heavy metals in: ? Things you eat or drink. ? The air around you.  Using certain medicines. What are the signs or symptoms?  Blood or spots of blood coming from the vagina. You may also have cramps or pain.  Pain or cramps in the belly or low back.  Fluid or tissue coming out of the vagina. How is this treated? Sometimes, treatment is not needed. If you need treatment, you may be  treated with:  A procedure to open the cervix more and take tissue out of the womb.  Medicines. You may get a shot of medicine called Rho(D) immune globulin. Follow these instructions at home: Medicines  Take over-the-counter and prescription medicines only as told by your doctor.  If you were prescribed antibiotic medicine, take it as told by your doctor. Do not stop taking it even if you start to feel better. Activity  Rest as told by your doctor. Ask your doctor what activities are safe for you.  Have someone help you at home during this time. General instructions  Watch how much tissue comes out of the vagina.  Watch the size of any blood clots that come out of the vagina.  Do not have sex or douche until your doctor says it is okay.  Do not put things, such as tampons, in your vagina until your doctor says it is okay.  To help you and your partner with grieving: ? Talk with your doctor. ? See a counselor.  When you are ready, talk with your doctor about: ? Things to do for your health. ? How you can be healthy if you get pregnant again.  Keep all follow-up visits.   Where to find more information  The American College of Obstetricians and Gynecologists: acog.org  U.[place] Department of Health and Human Services Office of Women's Health: hrsa.gov/office-womens-health Contact a doctor if:  You have a fever or chills.  There is bad-smelling fluid coming   from the vagina.  You have more bleeding.  Tissue or clots of blood come out of your vagina. Get help right away if:  You have very bad cramps or pain in your back or belly.  You soak more than 2 large pads in an hour for more than 2 hours.  You get light-headed or weak.  You faint.  You feel sad, and you have sad thoughts a lot of the time.  You think about hurting yourself. Get help right awayif you feel like you may hurt yourself or others, or have thoughts about taking your own life. Go to your nearest  emergency room or:  Call your local emergency services (911 in the [place]  Call the [place] at 6608069733. This is open 24 hours a day.  Text the Crisis Text Line at (579)270-8020. Summary  A miscarriage is the loss of a pregnancy before the 20th week of pregnancy. Sometimes, a pregnancy ends before a woman knows that she is pregnant.  Follow instructions from your doctor about medicines and activity.  To help you and your partner with grieving, talk with your doctor or a counselor.  Keep all follow-up visits. This information is not intended to replace advice given to you by your health care provider. Make sure you discuss any questions you have with your health care provider. Document Revised: 06/12/2020 Document Reviewed: 06/12/2020 Elsevier Patient Education  [place].    Human Chorionic Gonadotropin Test Why am I having this test? A human chorionic gonadotropin (hCG) test is done to determine whether you are pregnant. It can also be used:  To diagnose an abnormal pregnancy.  To determine whether you have had a miscarriage or are at risk of one. What is being tested? This test checks the level of the human chorionic gonadotropin (hCG) hormone in the blood. This hormone is produced during pregnancy by the cells that form the placenta. The placenta is the organ that grows inside your uterus to nourish a developing baby. When you are pregnant, hCG can be detected in your blood or urine 7 to 8 days before your missed period. The amount of hCG continues to increase for the first 8-10 weeks of pregnancy. The presence of hCG in your blood can be measured with different types of tests. You may have:  A urine test. ? A urine test only shows whether there is hCG in your urine. It does not measure how much.  A qualitative blood test. ? This blood test only shows whether there is hCG in your blood. It does not measure how much.  A quantitative blood  test. ? This type of blood test measures the amount of hCG in your blood. ? You may have this test to:  Diagnose an abnormal pregnancy.  Check whether you have had a miscarriage.  Determine whether you are at risk of a miscarriage.  Determine if treatment of an ectopic pregnancy is successful. What kind of sample is taken? Two kinds of samples may be collected to test for the hCG hormone.  Blood. It is usually collected by inserting a needle into a blood vessel.  Urine. It is usually collected by urinating into a germ-free (sterile) specimen cup.      How do I prepare for this test? No preparation is needed for a blood test.  Some preparation is needed for a urine test:  For best results, collect the sample the first time you urinate in the morning. That is when  the concentration of hCG is highest.  Do not drink too much fluid. Drink as you normally would, or as directed by your health care provider. Tell a health care provider about:  All medicines you are taking, including vitamins, herbs, eye drops, creams, and over-the-counter medicines.  Any blood in your urine. This may interfere with the result. How are the results reported? Depending on the type of test that you have, your test results may be reported as values. Your health care provider will compare your results to normal ranges that were established after testing a large group of people (reference ranges). Reference ranges may vary among labs and hospitals. For this test, common reference ranges that show absence of pregnancy are:  Quantitative hCG blood levels: less than 5 IU/L. Other results will be reported as either positive or negative. For this test, normal results (meaning the absence of pregnancy) are:  Negative for hCG in the urine test.  Negative for hCG in the qualitative blood test. What do the results mean? Urine and qualitative blood test  A negative result could mean: ? That you are not  pregnant. ? That the test was done too early in your pregnancy to detect hCG in your blood or urine. If you still have other signs of pregnancy, the test will be repeated.  A positive result means: ? That you are most likely pregnant. Your health care provider may confirm your pregnancy with an ultrasound of your uterus, if needed. Quantitative blood test Results of the quantitative hCG blood test will be reported as values. These values will be interpreted by your health care provider along with your medical history and symptoms you are experiencing. Results outside of expected ranges could mean that:  You are pregnant with twins.  You have abnormal growths in your uterus.  You have an ectopic pregnancy.  You may be experiencing a miscarriage. Talk with your health care provider about what your results mean. Questions to ask your health care provider Ask your health care provider, or the department that is doing the test:  When will my results be ready?  How will I get my results?  What are my treatment options?  What other tests do I need?  What are my next steps? Summary  A human chorionic gonadotropin (hCG) test is done to determine whether you are pregnant.  When you are pregnant, hCG can be detected in your blood or urine 7 to 8 days before your missed period. HCG levels continue to go up for the first 8-10 weeks of pregnancy.  Your hCG level can be measured with different types of tests. You may have a urine test, a qualitative blood test, or a quantitative blood test.  Talk with your health care provider about what your test results mean. This information is not intended to replace advice given to you by your health care provider. Make sure you discuss any questions you have with your health care provider. Document Revised: 09/14/2020 Document Reviewed: 09/14/2020 Elsevier Patient Education  [place].

## 2021-02-05 NOTE — MAU Note (Signed)
Jamie [name] is a 35 y.o. at [redacted]w[redacted]d here in MAU reporting: right lower abdominal cramping and pressure that started today. Also reporting spotting that started yesterday.  Onset of complaint: yesterday  Pain score: 7/10  Vitals:   02/05/21 1718  BP: 125/76  Pulse: 95  Resp: 16  Temp: 99.2 F (37.3 C)  SpO2: 99%     Lab orders placed from triage: UA

## 2021-02-08 ENCOUNTER — Telehealth: Payer: Self-pay | Admitting: Adult Health

## 2021-02-08 DIAGNOSIS — O209 Hemorrhage in early pregnancy, unspecified: Secondary | ICD-10-CM | POA: Diagnosis not present

## 2021-02-08 LAB — GC/CHLAMYDIA PROBE AMP (~~LOC~~) NOT AT ARMC
Chlamydia: NEGATIVE
Comment: NEGATIVE
Comment: NORMAL
Neisseria Gonorrhea: NEGATIVE

## 2021-02-08 NOTE — Telephone Encounter (Signed)
Patient stated that she miscarried over the weekend and was instructed to get her HCG levels checked. Clinical staaf will follow up with patient.

## 2021-02-08 NOTE — Telephone Encounter (Signed)
Pt bleeding like a period now, will place order for [facility] today

## 2021-02-09 ENCOUNTER — Other Ambulatory Visit: Payer: Self-pay | Admitting: Adult Health

## 2021-02-09 ENCOUNTER — Other Ambulatory Visit: Payer: Self-pay | Admitting: Obstetrics & Gynecology

## 2021-02-09 DIAGNOSIS — O209 Hemorrhage in early pregnancy, unspecified: Secondary | ICD-10-CM

## 2021-02-09 DIAGNOSIS — O3680X0 Pregnancy with inconclusive fetal viability, not applicable or unspecified: Secondary | ICD-10-CM

## 2021-02-09 LAB — BETA HCG QUANT (REF LAB): hCG Quant: 25 m[IU]/mL

## 2021-02-10 ENCOUNTER — Other Ambulatory Visit: Payer: Medicaid Other

## 2021-11-24 ENCOUNTER — Ambulatory Visit: Payer: Medicaid Other | Admitting: Women's Health

## 2022-01-06 ENCOUNTER — Ambulatory Visit: Payer: Medicaid Other

## 2022-02-11 ENCOUNTER — Ambulatory Visit (INDEPENDENT_AMBULATORY_CARE_PROVIDER_SITE_OTHER): Payer: Medicaid Other | Admitting: Obstetrics and Gynecology

## 2022-02-11 ENCOUNTER — Other Ambulatory Visit: Payer: Self-pay

## 2022-02-11 ENCOUNTER — Other Ambulatory Visit (HOSPITAL_COMMUNITY)
Admission: RE | Admit: 2022-02-11 | Discharge: 2022-02-11 | Disposition: A | Discharge status: Home / Self Care | Payer: Medicaid Other | Source: Ambulatory Visit | Attending: Women's Health | Admitting: Women's Health

## 2022-02-11 ENCOUNTER — Encounter: Payer: Self-pay | Admitting: Obstetrics and Gynecology

## 2022-02-11 DIAGNOSIS — Z309 Encounter for contraceptive management, unspecified: Secondary | ICD-10-CM | POA: Insufficient documentation

## 2022-02-11 DIAGNOSIS — Z202 Contact with and (suspected) exposure to infections with a predominantly sexual mode of transmission: Secondary | ICD-10-CM

## 2022-02-11 DIAGNOSIS — Z30011 Encounter for initial prescription of contraceptive pills: Secondary | ICD-10-CM

## 2022-02-11 DIAGNOSIS — R102 Pelvic and perineal pain: Secondary | ICD-10-CM | POA: Diagnosis not present

## 2022-02-11 MED ORDER — DESOGESTREL-ETHINYL ESTRADIOL 0.15-30 MG-MCG PO TABS: 1.0000 | ORAL_TABLET | Freq: Every day | ORAL | 11 refills | Status: DC

## 2022-02-11 NOTE — Progress Notes (Signed)
New patient presents for annual. Last pap normal 10/21/2020. Reports lower abdominal/ pelvic pain with a discharge. Reports some urinary symptoms.Desires blood work, STD screen. Wants to discuss initiating OCPs.

## 2022-02-11 NOTE — Progress Notes (Signed)
Jamie [name] presents with desire for STD testing. She reports a vaginal discharge off and on over the last 1 month. Last IC was 2 months ago. She also reports some pelvic pain. Describes as pressure like. No injury or trauma. Cycles are regular last 5-7 days. Cramps. She would like to start OCP's as well. Has used in the past without problems.  Denies any bowel or bladder dysfunction. 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 Vaginal swab collected  A/P Vaginal discharge        Pelvic pain        Contraception management  Vaginal swab collect. Treat as per results. Discussed contraception options with pt. Pt desires to start OCP's. U/R/B and backup method reviewed with pt. Rx provided. Will check GYN U/S as well. F/U in 4 months to gage response to OCP's at [facility] as she lives in [place].

## 2022-02-11 NOTE — Patient Instructions (Signed)

## 2022-02-12 LAB — HEPATITIS B SURFACE ANTIGEN: Hepatitis B Surface Ag: NEGATIVE

## 2022-02-12 LAB — HIV ANTIBODY (ROUTINE TESTING W REFLEX): HIV Screen 4th Generation wRfx: NONREACTIVE

## 2022-02-12 LAB — RPR: RPR Ser Ql: NONREACTIVE

## 2022-02-12 LAB — HEPATITIS C ANTIBODY: Hep C Virus Ab: NONREACTIVE

## 2022-02-14 ENCOUNTER — Other Ambulatory Visit: Payer: Self-pay

## 2022-02-14 LAB — CERVICOVAGINAL ANCILLARY ONLY
Bacterial Vaginitis (gardnerella): NEGATIVE
Candida Glabrata: NEGATIVE
Candida Vaginitis: POSITIVE — AB
Chlamydia: NEGATIVE
Comment: NEGATIVE
Comment: NEGATIVE
Comment: NEGATIVE
Comment: NEGATIVE
Comment: NEGATIVE
Comment: NORMAL
Neisseria Gonorrhea: NEGATIVE
Trichomonas: NEGATIVE

## 2022-02-14 MED ORDER — FLUCONAZOLE 150 MG PO TABS: 150.0000 mg | ORAL_TABLET | Freq: Once | ORAL | 0 refills | Status: AC

## 2022-02-18 ENCOUNTER — Ambulatory Visit (HOSPITAL_COMMUNITY): Payer: Medicaid Other

## 2022-02-25 ENCOUNTER — Ambulatory Visit (HOSPITAL_COMMUNITY): Admission: RE | Admit: 2022-02-25 | Payer: Medicaid Other | Source: Ambulatory Visit

## 2022-06-13 ENCOUNTER — Ambulatory Visit: Payer: Medicaid Other | Admitting: Obstetrics and Gynecology

## 2023-02-06 ENCOUNTER — Other Ambulatory Visit: Payer: Self-pay | Admitting: Obstetrics and Gynecology

## 2023-02-06 DIAGNOSIS — R102 Pelvic and perineal pain: Secondary | ICD-10-CM

## 2023-12-17 ENCOUNTER — Other Ambulatory Visit: Payer: Self-pay

## 2023-12-17 ENCOUNTER — Encounter (HOSPITAL_COMMUNITY): Payer: Self-pay | Admitting: Emergency Medicine

## 2023-12-17 ENCOUNTER — Emergency Department (HOSPITAL_COMMUNITY)
Admission: EM | Admit: 2023-12-17 | Discharge: 2023-12-17 | Disposition: A | Discharge status: Home / Self Care | Payer: Self-pay | Attending: Emergency Medicine | Admitting: Emergency Medicine

## 2023-12-17 DIAGNOSIS — B9689 Other specified bacterial agents as the cause of diseases classified elsewhere: Secondary | ICD-10-CM | POA: Insufficient documentation

## 2023-12-17 DIAGNOSIS — I10 Essential (primary) hypertension: Secondary | ICD-10-CM | POA: Insufficient documentation

## 2023-12-17 DIAGNOSIS — N760 Acute vaginitis: Secondary | ICD-10-CM | POA: Insufficient documentation

## 2023-12-17 DIAGNOSIS — N946 Dysmenorrhea, unspecified: Secondary | ICD-10-CM | POA: Insufficient documentation

## 2023-12-17 LAB — COMPREHENSIVE METABOLIC PANEL
ALT: 13 U/L (ref 0–44)
AST: 18 U/L (ref 15–41)
Albumin: 4.1 g/dL (ref 3.5–5.0)
Alkaline Phosphatase: 47 U/L (ref 38–126)
Anion gap: 10 (ref 5–15)
BUN: 13 mg/dL (ref 6–20)
CO2: 23 mmol/L (ref 22–32)
Calcium: 9.1 mg/dL (ref 8.9–10.3)
Chloride: 105 mmol/L (ref 98–111)
Creatinine, Ser: 0.78 mg/dL (ref 0.44–1.00)
GFR, Estimated: >60 mL/min (ref >60)
Glucose, Bld: 93 mg/dL (ref 70–99)
Potassium: 4 mmol/L (ref 3.5–5.1)
Sodium: 138 mmol/L (ref 135–145)
Total Bilirubin: 1 mg/dL (ref <1.2)
Total Protein: 7.4 g/dL (ref 6.5–8.1)

## 2023-12-17 LAB — WET PREP, GENITAL
Sperm: NONE SEEN
Trich, Wet Prep: NONE SEEN
WBC, Wet Prep HPF POC: <10 (ref <10)
Yeast Wet Prep HPF POC: NONE SEEN

## 2023-12-17 LAB — URINALYSIS, ROUTINE W REFLEX MICROSCOPIC
Bacteria, UA: NONE SEEN
Bilirubin Urine: NEGATIVE
Glucose, UA: NEGATIVE mg/dL
Ketones, ur: 20 mg/dL — AB
Nitrite: NEGATIVE
Protein, ur: 30 mg/dL — AB
RBC / HPF: >50 RBC/hpf (ref 0–5)
Specific Gravity, Urine: 1.02 (ref 1.005–1.030)
pH: 5 (ref 5.0–8.0)

## 2023-12-17 LAB — CBC
HCT: 41.8 % (ref 36.0–46.0)
Hemoglobin: 14 g/dL (ref 12.0–15.0)
MCH: 31 pg (ref 26.0–34.0)
MCHC: 33.5 g/dL (ref 30.0–36.0)
MCV: 92.5 fL (ref 80.0–100.0)
Platelets: 226 10*3/uL (ref 150–400)
RBC: 4.52 MIL/uL (ref 3.87–5.11)
RDW: 11.6 % (ref 11.5–15.5)
WBC: 9.5 10*3/uL (ref 4.0–10.5)
nRBC: 0 % (ref 0.0–0.2)

## 2023-12-17 LAB — POC URINE PREG, ED: Preg Test, Ur: NEGATIVE

## 2023-12-17 LAB — LIPASE, BLOOD: Lipase: 29 U/L (ref 11–51)

## 2023-12-17 MED ORDER — METRONIDAZOLE 500 MG PO TABS: 500.0000 mg | ORAL_TABLET | Freq: Two times a day (BID) | ORAL | 0 refills | Status: DC

## 2023-12-17 MED ORDER — IBUPROFEN 600 MG PO TABS: 600.0000 mg | ORAL_TABLET | Freq: Four times a day (QID) | ORAL | 0 refills | Status: DC | PRN

## 2023-12-17 NOTE — ED Provider Notes (Signed)
[place] EMERGENCY DEPARTMENT AT [facility] Provider Note   CSN: [id number] Arrival date & time: 12/17/23  1015     History  Chief Complaint  Patient presents with   Abdominal Pain    Jamie [name] is a 37 y.o. 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  She states her periods are always very regular, typically lasting 5 days and unremarkable, this.  However started on December 16, therefore is currently day 6 and having significant mid lower pelvic cramping along with passage of blood clots.  She also endorses having fatigue, denies dizziness, also denies vaginal discharge, doubts risk for STDs although states she is sexually active.  She is interested in being screened for STDs.  [facility] is under [facility] care of [facility] for [facility] care.  The history is provided by the patient.       Home Medications Prior to Admission medications   Medication Sig Start Date End Date Taking? Authorizing Provider  ibuprofen (ADVIL) 600 MG tablet Take 1 tablet (600 mg total) by mouth every 6 (six) hours as needed. 12/17/23  Yes Sheriff Rodenberg, Raynelle Fanning, PA-C  metroNIDAZOLE (FLAGYL) 500 MG tablet Take 1 tablet (500 mg total) by mouth 2 (two) times daily. 12/17/23  Yes IdolRaynelle Fanning, PA-C  desogestrel-ethinyl estradiol (APRI) 0.15-30 MG-MCG tablet Take 1 tablet by mouth daily. 02/11/22   Hermina Staggers, MD  Multiple Vitamins-Minerals (MULTIVITAMIN GUMMIES ADULT PO) Take by mouth.    [provider]      Allergies    Patient has no known allergies.    Review of Systems   Review of Systems  Constitutional:  Negative for chills and fever.  HENT: Negative.    Eyes: Negative.   Respiratory:  Negative for chest tightness and shortness of breath.   Cardiovascular:  Negative for chest pain.  Gastrointestinal:  Negative for abdominal pain and nausea.  Genitourinary:  Positive for menstrual problem. Negative for dysuria and  vaginal discharge.  Musculoskeletal:  Negative for arthralgias, joint swelling and neck pain.  Skin: Negative.  Negative for rash and wound.  Neurological:  Negative for dizziness, weakness, light-headedness, numbness and headaches.  Psychiatric/Behavioral: Negative.    All other systems reviewed and are negative.   Physical Exam Updated Vital Signs BP (!) 137/90 (BP Location: Left Arm)   Pulse 61   Temp 98.3 F (36.8 C) (Oral)   Resp 17   LMP 12/11/2023 (Exact Date)   SpO2 96%  Physical Exam Vitals and nursing note reviewed. Exam conducted with a chaperone present.  Constitutional:      Appearance: She is well-developed.  HENT:     Head: Normocephalic and atraumatic.  Eyes:     Conjunctiva/sclera: Conjunctivae normal.  Cardiovascular:     Rate and Rhythm: Normal rate and regular rhythm.     Heart sounds: Normal heart sounds.  Pulmonary:     Effort: Pulmonary effort is normal.     Breath sounds: Normal breath sounds. No wheezing.  Abdominal:     General: Bowel sounds are normal.     Palpations: Abdomen is soft.     Tenderness: There is no abdominal tenderness. There is no guarding or rebound.  Genitourinary:    Vagina: Normal.     Cervix: Cervical bleeding present. No cervical motion tenderness.     Uterus: Normal. Not enlarged and not tender.      Adnexa: Right adnexa normal and left adnexa normal.  Right: No mass or tenderness.         Left: No mass or tenderness.       Comments: Small amount of blood in vaginal vault, no clot noted. Musculoskeletal:        General: Normal range of motion.     Cervical back: Normal range of motion.  Skin:    General: Skin is warm and dry.  Neurological:     Mental Status: She is alert.     ED Results / Procedures / Treatments   Labs (all labs ordered are listed, but only abnormal results are displayed) Labs Reviewed  WET PREP, GENITAL - Abnormal; Notable for the following components:      Result Value   Clue Cells Wet  Prep HPF POC PRESENT (*)    All other components within normal limits  URINALYSIS, ROUTINE W REFLEX MICROSCOPIC - Abnormal; Notable for the following components:   Hgb urine dipstick LARGE (*)    Ketones, ur 20 (*)    Protein, ur 30 (*)    Leukocytes,Ua SMALL (*)    All other components within normal limits  LIPASE, BLOOD  COMPREHENSIVE METABOLIC PANEL  CBC  HIV ANTIBODY (ROUTINE TESTING W REFLEX)  POC URINE PREG, ED  POC URINE PREG, ED  GC/CHLAMYDIA PROBE AMP ([place]) NOT AT [facility]    EKG None  Radiology No results found.  Procedures Procedures    Medications Ordered in ED Medications - No data to display  ED Course/ Medical Decision Making/ A&P                                 Medical Decision Making Patient presenting with symptoms suggesting simple dysmenorrhea.  She does have some mid line pelvic cramping suspicious for menstrual cramping.  Her pelvic exam is reassuring with no CMT, no adnexal tenderness or mass appreciated.  I doubt this represents ovarian cyst, torsion.  She also does not have a history or exam that would suggest STD although GC/chlamydia culture is collected and pending.  Her labs as outlined below are reassuring.  She is placed on ibuprofen which she states has helped with the cramping.  She is encouraged to follow-up with her gynecologist at [facility] for further evaluation management if her symptoms persist or worsen.  She was stable at time of discharge.  Amount and/or Complexity of Data Reviewed Labs: ordered.    Details: Urinalysis positive for large hemoglobin which is not unexpected as she is on her menses and this was a clean-catch specimen.  No evidence for infection.  Her c-Met is normal, lipase and CBC are also normal.  Her wet prep is positive for clue cells.  Risk Prescription drug management.           Final Clinical Impression(s) / ED Diagnoses Final diagnoses:  Dysmenorrhea  Bacterial vaginosis    Rx / DC  Orders ED Discharge Orders          Ordered    ibuprofen (ADVIL) 600 MG tablet  Every 6 hours PRN        12/17/23 1512    metroNIDAZOLE (FLAGYL) 500 MG tablet  2 times daily        12/17/23 1604              Burgess Amor, Cordelia Poche 12/17/23 1604    Gerhard Munch, MD 12/18/23 [phone]

## 2023-12-17 NOTE — ED Provider Triage Note (Signed)
Emergency Medicine Provider Triage Evaluation Note  Jamie [name] , a 37 y.o. female  was evaluated in triage.  Pt complains of generalized weakness continued vaginal bleeding.  Per triage, patient began menstrual cycle on December 16.  She did not want to describe any detail her current symptoms as she is sitting out in the triage area.  Review of Systems  Positive: Fatigue and continued vaginal bleeding Negative: Fever, nausea vomiting  Physical Exam  BP 123/86 (BP Location: Right Arm)   Pulse 66   Temp 98.3 F (36.8 C) (Oral)   Resp 16   LMP 12/11/2023 (Exact Date)   SpO2 100%  Gen:   Awake, no distress   Resp:  Normal effort  MSK:   Moves extremities without difficulty  Other:    Medical Decision Making  Medically screening exam initiated at 12:55 PM.  Appropriate orders placed.  Jamie [name] was informed that the remainder of the evaluation will be completed by another provider, this initial triage assessment does not replace that evaluation, and the importance of remaining in the ED until their evaluation is complete.     Pauline Aus, PA-C 12/17/23 1304

## 2023-12-17 NOTE — ED Triage Notes (Signed)
Started period dec 16, states still has period and stil passing  clots, pt c/o being "fatigue". Nad. Ambulatory.

## 2023-12-17 NOTE — Discharge Instructions (Addendum)
Your lab tests are normal today. Remember that your gonorrhea/chlamydia cultures are still pending,  but based on your exam and symptoms, I suspect these will be negative.  Continue taking your ibuprofen and plan to see your gynecologist if your symptoms persist.

## 2023-12-18 ENCOUNTER — Emergency Department (HOSPITAL_COMMUNITY): Payer: Self-pay

## 2023-12-18 ENCOUNTER — Encounter (HOSPITAL_COMMUNITY): Payer: Self-pay

## 2023-12-18 ENCOUNTER — Other Ambulatory Visit: Payer: Self-pay

## 2023-12-18 ENCOUNTER — Emergency Department (HOSPITAL_COMMUNITY)
Admission: EM | Admit: 2023-12-18 | Discharge: 2023-12-18 | Disposition: A | Discharge status: Home / Self Care | Payer: Self-pay | Attending: Emergency Medicine | Admitting: Emergency Medicine

## 2023-12-18 DIAGNOSIS — K8020 Calculus of gallbladder without cholecystitis without obstruction: Secondary | ICD-10-CM | POA: Insufficient documentation

## 2023-12-18 DIAGNOSIS — D72829 Elevated white blood cell count, unspecified: Secondary | ICD-10-CM | POA: Insufficient documentation

## 2023-12-18 DIAGNOSIS — N739 Female pelvic inflammatory disease, unspecified: Secondary | ICD-10-CM | POA: Insufficient documentation

## 2023-12-18 DIAGNOSIS — R9389 Abnormal findings on diagnostic imaging of other specified body structures: Secondary | ICD-10-CM

## 2023-12-18 DIAGNOSIS — A549 Gonococcal infection, unspecified: Secondary | ICD-10-CM | POA: Insufficient documentation

## 2023-12-18 DIAGNOSIS — N730 Acute parametritis and pelvic cellulitis: Secondary | ICD-10-CM

## 2023-12-18 DIAGNOSIS — R933 Abnormal findings on diagnostic imaging of other parts of digestive tract: Secondary | ICD-10-CM | POA: Insufficient documentation

## 2023-12-18 LAB — CBC WITH DIFFERENTIAL/PLATELET
Abs Immature Granulocytes: 0.05 10*3/uL (ref 0.00–0.07)
Basophils Absolute: 0 10*3/uL (ref 0.0–0.1)
Basophils Relative: 0 %
Eosinophils Absolute: 0.1 10*3/uL (ref 0.0–0.5)
Eosinophils Relative: 1 %
HCT: 37.1 % (ref 36.0–46.0)
Hemoglobin: 12.6 g/dL (ref 12.0–15.0)
Immature Granulocytes: 0 %
Lymphocytes Relative: 14 %
Lymphs Abs: 1.7 10*3/uL (ref 0.7–4.0)
MCH: 31 pg (ref 26.0–34.0)
MCHC: 34 g/dL (ref 30.0–36.0)
MCV: 91.4 fL (ref 80.0–100.0)
Monocytes Absolute: 0.9 10*3/uL (ref 0.1–1.0)
Monocytes Relative: 7 %
Neutro Abs: 9.5 10*3/uL — ABNORMAL HIGH (ref 1.7–7.7)
Neutrophils Relative %: 78 %
Platelets: 215 10*3/uL (ref 150–400)
RBC: 4.06 MIL/uL (ref 3.87–5.11)
RDW: 11.7 % (ref 11.5–15.5)
WBC: 12.2 10*3/uL — ABNORMAL HIGH (ref 4.0–10.5)
nRBC: 0 % (ref 0.0–0.2)

## 2023-12-18 LAB — URINALYSIS, ROUTINE W REFLEX MICROSCOPIC
Bacteria, UA: NONE SEEN
Bilirubin Urine: NEGATIVE
Glucose, UA: NEGATIVE mg/dL
Ketones, ur: NEGATIVE mg/dL
Nitrite: NEGATIVE
Protein, ur: NEGATIVE mg/dL
Specific Gravity, Urine: >1.046 — ABNORMAL HIGH (ref 1.005–1.030)
pH: 6 (ref 5.0–8.0)

## 2023-12-18 LAB — COMPREHENSIVE METABOLIC PANEL
ALT: 13 U/L (ref 0–44)
AST: 16 U/L (ref 15–41)
Albumin: 3.8 g/dL (ref 3.5–5.0)
Alkaline Phosphatase: 50 U/L (ref 38–126)
Anion gap: 11 (ref 5–15)
BUN: 13 mg/dL (ref 6–20)
CO2: 24 mmol/L (ref 22–32)
Calcium: 8.8 mg/dL — ABNORMAL LOW (ref 8.9–10.3)
Chloride: 105 mmol/L (ref 98–111)
Creatinine, Ser: 0.79 mg/dL (ref 0.44–1.00)
GFR, Estimated: >60 mL/min (ref >60)
Glucose, Bld: 104 mg/dL — ABNORMAL HIGH (ref 70–99)
Potassium: 3.5 mmol/L (ref 3.5–5.1)
Sodium: 140 mmol/L (ref 135–145)
Total Bilirubin: 0.2 mg/dL (ref <1.2)
Total Protein: 6.7 g/dL (ref 6.5–8.1)

## 2023-12-18 LAB — LIPASE, BLOOD: Lipase: 30 U/L (ref 11–51)

## 2023-12-18 LAB — HCG, QUANTITATIVE, PREGNANCY: hCG, Beta Chain, Quant, S: <1 m[IU]/mL (ref <5)

## 2023-12-18 MED ORDER — METRONIDAZOLE 500 MG PO TABS: 500.0000 mg | ORAL_TABLET | Freq: Two times a day (BID) | ORAL | 0 refills | Status: DC

## 2023-12-18 MED ORDER — DOXYCYCLINE HYCLATE 100 MG PO CAPS: 100.0000 mg | ORAL_CAPSULE | Freq: Two times a day (BID) | ORAL | 0 refills | Status: AC

## 2023-12-18 MED ORDER — IOHEXOL 350 MG/ML SOLN
75.0000 mL | Freq: Once | INTRAVENOUS | Status: AC | PRN
  Administered 2023-12-18: 75 mL via INTRAVENOUS

## 2023-12-18 MED ORDER — CEFTRIAXONE SODIUM 500 MG IJ SOLR
500.0000 mg | Freq: Once | INTRAMUSCULAR | Status: AC
  Administered 2023-12-18: 500 mg via INTRAMUSCULAR
  Filled 2023-12-18: qty 500

## 2023-12-18 NOTE — Discharge Instructions (Addendum)
You were seen in the emergency department today for evaluation of your lower abdominal pain.  Your CT scan showed that you had some gallstones.  These may become troublesome over time.  I given you the information for [facility] for you to call to schedule follow-up appointment with.  If you have any lasting or upper quadrant pain or worsening nausea or vomiting or any fever, return to the ER.  Additionally, they have seen an abnormality with your kidney.  They are recommending outpatient ultrasound.  You follow-up with your primary care doctor for this.  Additionally, it was discovered that you have gonorrhea.  This is likely the cause of your pain.  You likely have pelvic inflammatory disease.  For this, it will require you to be on antibiotics for 2 weeks.  You were already given a week of Flagyl/metronidazole yesterday.  Please continue taking that as directed.  When you have finished that bottle, please continue taking the new bottle of Flagyl I have prescribed to you.  Today, you will need to start taking the doxycycline in addition to the Flagyl you already have.  Please take these as prescribed.  Do not consume any alcohol while on these medications as it will make you extremely ill.  You will need to abstain from sex for at least 3 weeks.  You would need to alert partners to positive results as a will need to be treated as well.  You need to follow-up with a primary care doctor for evaluation.  If you have any concerns, new or worsening symptoms, please return to your nearest emergency department for reevaluation. Contact a doctor if: You have more fluid or fluid that is not normal coming from your vagina. Your pain does not get better. You have a fever or chills. You cannot take your medicines. Your partner has an STI. You have pain when you pee. Get help right away if: You have more pain in the belly area. Your symptoms are getting worse. You are not better in 72 hours with  treatment.

## 2023-12-18 NOTE — ED Triage Notes (Signed)
Pt c/o RLQ abdominal pain and radiated to RUQ abdominal pain this morning. Pt states this started 3 days ago. Pt c/o nausea, but denies V/D

## 2023-12-18 NOTE — ED Provider Notes (Signed)
EMERGENCY DEPARTMENT AT [facility] Provider Note   CSN: [id number] Arrival date & time: 12/18/23  0820     History  Chief Complaint  Patient presents with   Abdominal Pain    Jamie [name] is a 37 y.o. female reportedly otherwise healthy presents to the emerged from today for evaluation of lower abdominal pain for the past 3 days that is felt like she has been radiating up to her lower flank/back as well.  She reports that she was on her menstrual cycle and has been having some clots around quarter size and thin but has not been having any today.  She reports that the clots are new.  She did about them today.  Reports pain in her abdomen with a bowel movement however did not have any rectal pain.  No melena or hematochezia.  Denies any diarrhea or constipation.  No dysuria or hematuria.  Reports of intermittent nausea.  No vomiting, urgency, or frequency of urination.  Denies any fever.  She was seen yesterday at any pain and received a pelvic exam.  She reports that she was given ibuprofen and Flagyl.  Reports the ibuprofen is helping with her pain however it is still present.  She has surgical history of C-sections.  Denies any medical problems or daily medications.  No known drug allergies.  Occasional tobacco use.  Denies any EtOH or drugs.   Abdominal Pain Associated symptoms: vaginal bleeding (on cycle)   Associated symptoms: no chest pain, no chills, no constipation, no diarrhea, no dysuria, no fever, no hematuria, no nausea, no shortness of breath, no vaginal discharge and no vomiting        Home Medications Prior to Admission medications   Medication Sig Start Date End Date Taking? Authorizing Provider  desogestrel-ethinyl estradiol (APRI) 0.15-30 MG-MCG tablet Take 1 tablet by mouth daily. 02/11/22   Hermina Staggers, MD  ibuprofen (ADVIL) 600 MG tablet Take 1 tablet (600 mg total) by mouth every 6 (six) hours as needed. 12/17/23   Burgess Amor, PA-C   metroNIDAZOLE (FLAGYL) 500 MG tablet Take 1 tablet (500 mg total) by mouth 2 (two) times daily. 12/17/23   Burgess Amor, PA-C  Multiple Vitamins-Minerals (MULTIVITAMIN GUMMIES ADULT PO) Take by mouth.    [provider]      Allergies    Patient has no known allergies.    Review of Systems   Review of Systems  Constitutional:  Negative for chills and fever.  Respiratory:  Negative for shortness of breath.   Cardiovascular:  Negative for chest pain.  Gastrointestinal:  Positive for abdominal pain. Negative for blood in stool, constipation, diarrhea, nausea, rectal pain and vomiting.  Genitourinary:  Positive for vaginal bleeding (on cycle). Negative for dysuria, hematuria, pelvic pain, vaginal discharge and vaginal pain.    Physical Exam Updated Vital Signs BP 135/88   Pulse 88   Temp 98.4 F (36.9 C) (Oral)   Resp 17   Ht 5\' 1"  (1.549 m)   Wt 70.6 kg   LMP 12/11/2023 (Exact Date)   SpO2 100%   BMI 29.41 kg/m  Physical Exam Vitals and nursing note reviewed.  Constitutional:      General: She is not in acute distress.    Appearance: She is not ill-appearing or toxic-appearing.  Eyes:     General: No scleral icterus. Cardiovascular:     Rate and Rhythm: Normal rate.  Pulmonary:     Effort: Pulmonary effort is normal. No respiratory  distress.  Abdominal:     General: There is no distension.     Palpations: Abdomen is soft.     Tenderness: There is abdominal tenderness. There is no guarding or rebound. Negative signs include Murphy's sign.     Comments: Lower, more suprapubic abdominal tenderness to palpation. Soft. Non distended.   Genitourinary:    Comments: Patient declined Skin:    General: Skin is warm and dry.  Neurological:     Mental Status: She is alert.     ED Results / Procedures / Treatments   Labs (all labs ordered are listed, but only abnormal results are displayed) Labs Reviewed  CBC WITH DIFFERENTIAL/PLATELET  COMPREHENSIVE METABOLIC  PANEL  URINALYSIS, ROUTINE W REFLEX MICROSCOPIC  LIPASE, BLOOD    EKG None  Radiology US Pelvis Complete Result Date: 12/18/2023 CLINICAL DATA:  Lower abdominal pain, rule out torsion EXAM: TRANSABDOMINAL AND TRANSVAGINAL ULTRASOUND OF PELVIS DOPPLER ULTRASOUND OF OVARIES TECHNIQUE: Both transabdominal and transvaginal ultrasound examinations of the pelvis were performed. Transabdominal technique was performed for global imaging of the pelvis including uterus, ovaries, adnexal regions, and pelvic cul-de-sac. It was necessary to proceed with endovaginal exam following the transabdominal exam to visualize the uterus, endometrium, ovaries, and adnexa. Color and duplex Doppler ultrasound was utilized to evaluate blood flow to the ovaries. COMPARISON:  None Available. FINDINGS: Uterus Measurements: 7.8 x 4.2 x 4.7 cm = volume: 80 mL. No fibroids or other mass visualized. Endometrium Thickness: 0.5 cm.  No focal abnormality visualized. Right ovary Measurements: 2.4 x 1.8 x 1.3 cm = volume: 3 mL. Normal appearance/no adnexal mass. Left ovary Measurements: 2.3 x 1.7 x 1.7 cm = volume: 4 mL. Normal appearance/no adnexal mass. Pulsed Doppler evaluation of both ovaries demonstrates normal low-resistance arterial and venous waveforms. Other findings No abnormal free fluid. IMPRESSION: No acute ultrasound findings of the pelvis. Normal size and appearance of the ovaries with arterial and venous Doppler flow bilaterally. Electronically Signed   By: Jearld Lesch M.D.   On: 12/18/2023 14:48   US Transvaginal Non-OB Result Date: 12/18/2023 CLINICAL DATA:  Lower abdominal pain, rule out torsion EXAM: TRANSABDOMINAL AND TRANSVAGINAL ULTRASOUND OF PELVIS DOPPLER ULTRASOUND OF OVARIES TECHNIQUE: Both transabdominal and transvaginal ultrasound examinations of the pelvis were performed. Transabdominal technique was performed for global imaging of the pelvis including uterus, ovaries, adnexal regions, and pelvic cul-de-sac.  It was necessary to proceed with endovaginal exam following the transabdominal exam to visualize the uterus, endometrium, ovaries, and adnexa. Color and duplex Doppler ultrasound was utilized to evaluate blood flow to the ovaries. COMPARISON:  None Available. FINDINGS: Uterus Measurements: 7.8 x 4.2 x 4.7 cm = volume: 80 mL. No fibroids or other mass visualized. Endometrium Thickness: 0.5 cm.  No focal abnormality visualized. Right ovary Measurements: 2.4 x 1.8 x 1.3 cm = volume: 3 mL. Normal appearance/no adnexal mass. Left ovary Measurements: 2.3 x 1.7 x 1.7 cm = volume: 4 mL. Normal appearance/no adnexal mass. Pulsed Doppler evaluation of both ovaries demonstrates normal low-resistance arterial and venous waveforms. Other findings No abnormal free fluid. IMPRESSION: No acute ultrasound findings of the pelvis. Normal size and appearance of the ovaries with arterial and venous Doppler flow bilaterally. Electronically Signed   By: Jearld Lesch M.D.   On: 12/18/2023 14:48   [place] Art/Ven Flow Abd Pelv Doppler Result Date: 12/18/2023 CLINICAL DATA:  Lower abdominal pain, rule out torsion EXAM: TRANSABDOMINAL AND TRANSVAGINAL ULTRASOUND OF PELVIS DOPPLER ULTRASOUND OF OVARIES TECHNIQUE: Both transabdominal and transvaginal ultrasound examinations of  the pelvis were performed. Transabdominal technique was performed for global imaging of the pelvis including uterus, ovaries, adnexal regions, and pelvic cul-de-sac. It was necessary to proceed with endovaginal exam following the transabdominal exam to visualize the uterus, endometrium, ovaries, and adnexa. Color and duplex Doppler ultrasound was utilized to evaluate blood flow to the ovaries. COMPARISON:  None Available. FINDINGS: Uterus Measurements: 7.8 x 4.2 x 4.7 cm = volume: 80 mL. No fibroids or other mass visualized. Endometrium Thickness: 0.5 cm.  No focal abnormality visualized. Right ovary Measurements: 2.4 x 1.8 x 1.3 cm = volume: 3 mL. Normal appearance/no  adnexal mass. Left ovary Measurements: 2.3 x 1.7 x 1.7 cm = volume: 4 mL. Normal appearance/no adnexal mass. Pulsed Doppler evaluation of both ovaries demonstrates normal low-resistance arterial and venous waveforms. Other findings No abnormal free fluid. IMPRESSION: No acute ultrasound findings of the pelvis. Normal size and appearance of the ovaries with arterial and venous Doppler flow bilaterally. Electronically Signed   By: Jearld Lesch M.D.   On: 12/18/2023 14:48   CT ABDOMEN PELVIS W CONTRAST Result Date: 12/18/2023 CLINICAL DATA:  Progressive right lower quadrant abdominal pain for several days. EXAM: CT ABDOMEN AND PELVIS WITH CONTRAST TECHNIQUE: Multidetector CT imaging of the abdomen and pelvis was performed using the standard protocol following bolus administration of intravenous contrast. RADIATION DOSE REDUCTION: This exam was performed according to the departmental dose-optimization program which includes automated exposure control, adjustment of the mA and/or kV according to patient size and/or use of iterative reconstruction technique. CONTRAST:  75mL OMNIPAQUE IOHEXOL 350 MG/ML SOLN COMPARISON:  Abdominopelvic CT 06/14/2015 FINDINGS: Lower chest: Clear lung bases. No significant pleural or pericardial effusion. Hepatobiliary: The liver is normal in density without suspicious focal abnormality. There are several calcified gallstones. No evidence of gallbladder wall thickening, pericholecystic fluid or biliary dilatation. Pancreas: Unremarkable. No pancreatic ductal dilatation or surrounding inflammatory changes. Spleen: Normal in size without focal abnormality. Adrenals/Urinary Tract: Both adrenal glands appear normal. Possible punctate nonobstructing calculus in the interpolar region of the right kidney, best seen on sagittal image 54/7. No other urinary tract calculi demonstrated. There is no hydronephrosis or perinephric soft tissue stranding. Indeterminate lesion in the posterior interpolar  region of the left kidney measuring 1.5 x 1.4 cm on image 26/3 is not a simple cyst but may have been present previously (previously smaller). The bladder appears unremarkable for its degree of distention. Stomach/Bowel: No enteric contrast administered. The stomach appears unremarkable for its degree of distension. No evidence of bowel wall thickening, distention or surrounding inflammatory change. The appendix appears normal. There are few scattered colonic diverticula. Vascular/Lymphatic: There are no enlarged abdominal or pelvic lymph nodes. No significant vascular findings. Reproductive: The uterus and ovaries appear unremarkable. No adnexal mass. Other: No evidence of abdominal wall mass or hernia. No ascites or pneumoperitoneum. Musculoskeletal: No acute or significant osseous findings. IMPRESSION: 1. No acute findings or explanation for the patient's symptoms. The appendix appears normal. 2. Cholelithiasis without evidence of acute cholecystitis or biliary dilatation. 3. Possible punctate nonobstructing right renal calculus. No hydronephrosis. 4. Indeterminate left renal lesion, possibly a hemorrhagic or proteinaceous cyst. Recommend further evaluation with renal ultrasound. Electronically Signed   By: Carey Bullocks M.D.   On: 12/18/2023 10:48    Procedures Procedures   Medications Ordered in ED Medications - No data to display  ED Course/ Medical Decision Making/ A&P Clinical Course as of 12/18/23 1218  Mon Dec 18, 2023  1136 Spoke with radiologist. Reports that the  renal US can be done outpatient.  [RR]    Clinical Course User Index [RR] Achille Rich, PA-C   Medical Decision Making Amount and/or Complexity of Data Reviewed Labs: ordered. Radiology: ordered.  Risk Prescription drug management.   37 y.o. female presents to the ER for evaluation of lower abdominal pain. Differential diagnosis includes but is not limited to AAA, mesenteric ischemia, appendicitis, diverticulitis,  DKA, gastroenteritis, nephrolithiasis, pancreatitis, constipation, UTI, bowel obstruction, biliary disease, IBD, PUD, hepatitis, ectopic pregnancy, ovarian torsion, PID. Vital signs unremarkable. Physical exam as noted above.   On previous chart evaluation, the patient was seen yesterday for similar presentation. Wet prep showed clue cells and she was given Flagyl and ibuprofen for pain. Urinalysis showed some blood, but the patient was on her cycle. She had a pelvic exam that showed a small amount of bleeding. She returns today for the pain. Reports that she is only having a small amount of spotting now that she is at the end of her cycle. Will repeat labs and order CT scan.   I independently reviewed and interpreted the patient's labs.  CBC shows mild leukocytosis of 12.2 with a left shift.  No anemia.  CMP shows glucose at 104, mildly decreased calcium at 8.8, otherwise no electrolyte or LFT abnormalities.  Lipase within normal limit at 30.  Urinalysis shows concentrated urine with small and hemoglobin and trace leukocytes however no bacteria or white blood cells seen.  Will order culture.  hCG undetectable.  CT ABD shows : 1. No acute findings or explanation for the patient's symptoms. The appendix appears normal. 2. Cholelithiasis without evidence of acute cholecystitis or biliary dilatation. 3. Possible punctate nonobstructing right renal calculus. No hydronephrosis. 4. Indeterminate left renal lesion, possibly a hemorrhagic or proteinaceous cyst. Recommend further evaluation with renal ultrasound. Per radiologist's interpretation.    I consulted the radiologist about the last read on the CT image. He reports that is can be done in an outpatient setting.   US shows No acute ultrasound findings of the pelvis. Normal size and appearance of the ovaries with arterial and venous Doppler flow bilaterally.   I have reexamined the patient's abdomen.  Remains unchanged.  She has lower suprapubic tenderness  palpation some occasional right or lower but mainly remains midline.  I do not appreciate any upper abdominal tenderness.  She has a negative Murphy sign.  Patient denies any pain in her upper right quadrant or any radiation to her back at this level.  While the patient was waiting, her gonorrhea report from yesterday came back as positive.  Patient may be experiencing some PID.  She is declined an additional pelvic exam.  Will treat empirically.  She will receive Rocephin here as well as 2 weeks of doxycycline.  Will provide her an additional week of the metronidazole as she already received a week at [facility] [place].  I discussed the patient needs to abstain from sex and alert partners as they need to be tested and treated.  Recommended follow-up with primary care provider for test of cure.  Additionally, I discussed with her about the abnormal kidney finding with recommended for outpatient ultrasound.  Also discussed with her the cholelithiasis.  I given her the ration for general surgeon to follow-up with.  Since she is not having any fevers or flank pain or right upper quadrant pain as well as there is no signs of cholecystitis seen on CT imaging, I think this is less likely.  Will give patient's return precautions  to return if she has any lasting or worsening right upper quadrant pain.  Ultrasound does not show any signs of torsion.  She stable for discharge home with outpatient follow-up.  We discussed the results of the labs/imaging. The plan is follow-up with general [place] surgery, follow-up with kidney ultrasound, follow-up with primary care doctor, take medications as prescribed.. We discussed strict return precautions and red flag symptoms. The patient verbalized their understanding and agrees to the plan. The patient is stable and being discharged home in good condition.  Portions of this report may have been transcribed using voice recognition software. Every effort was made to ensure accuracy;  however, inadvertent computerized transcription errors may be present.   Final Clinical Impression(s) / ED Diagnoses Final diagnoses:  PID (acute pelvic inflammatory disease)  Gonorrhea  Calculus of gallbladder without cholecystitis without obstruction  Abnormal CT scan    Rx / DC Orders ED Discharge Orders          Ordered    doxycycline (VIBRAMYCIN) 100 MG capsule  2 times daily        12/18/23 1533    metroNIDAZOLE (FLAGYL) 500 MG tablet  2 times daily        12/18/23 1533              Achille Rich, PA-C 12/18/23 1536    Virgina Norfolk, DO 12/19/23 [phone]

## 2023-12-19 LAB — GC/CHLAMYDIA PROBE AMP (~~LOC~~) NOT AT ARMC
Chlamydia: NEGATIVE
Comment: NEGATIVE
Comment: NORMAL
Neisseria Gonorrhea: POSITIVE — AB

## 2024-04-04 ENCOUNTER — Encounter: Payer: Self-pay | Admitting: Emergency Medicine

## 2024-04-04 ENCOUNTER — Other Ambulatory Visit: Payer: Self-pay

## 2024-04-04 ENCOUNTER — Ambulatory Visit
Admission: EM | Admit: 2024-04-04 | Discharge: 2024-04-04 | Disposition: A | Discharge status: Home / Self Care | Attending: Family Medicine | Admitting: Family Medicine

## 2024-04-04 DIAGNOSIS — J3089 Other allergic rhinitis: Secondary | ICD-10-CM

## 2024-04-04 DIAGNOSIS — J0100 Acute maxillary sinusitis, unspecified: Secondary | ICD-10-CM | POA: Diagnosis not present

## 2024-04-04 MED ORDER — AMOXICILLIN-POT CLAVULANATE 875-125 MG PO TABS: 1.0000 | ORAL_TABLET | Freq: Two times a day (BID) | ORAL | 0 refills | Status: DC

## 2024-04-04 MED ORDER — CETIRIZINE HCL 10 MG PO TABS: 10.0000 mg | ORAL_TABLET | Freq: Every day | ORAL | 2 refills | Status: DC

## 2024-04-04 MED ORDER — FLUTICASONE PROPIONATE 50 MCG/ACT NA SUSP: 1.0000 | Freq: Two times a day (BID) | NASAL | 2 refills | Status: DC

## 2024-04-04 NOTE — ED Triage Notes (Signed)
 Pt reports has had nasal congestion, cough, facial pressure x9 days. Pt reports has used mucinex and flonase with minimal change of symptoms.

## 2024-04-04 NOTE — Discharge Instructions (Signed)
 I have prescribed an allergy regimen for you to take daily consistently for seasonal allergy control to include Zyrtec and Flonase nasal spray.  I have also prescribed an antibiotic for a sinus infection and you may use cold and congestion medications as well as warm saline sinus rinses as needed.

## 2024-04-04 NOTE — ED Provider Notes (Signed)
 [facility]    CSN: [id number] Arrival date & time: 04/04/24  1121      History   Chief Complaint Chief Complaint  Patient presents with   Nasal Congestion    HPI Jamie [name] is a 38 y.o. female.   Presenting today with a week and a half history of nasal congestion, sinus pain and pressure, sinus headache.  Denies fever, chills, body aches, cough, chest pain, shortness of breath.  Trying Mucinex sinus and Flonase with mild temporary benefit.  States symptoms have been worsening the past few days.  History of seasonal allergies, tried some Claritin a few days ago but did not know if she should keep taking it.    Past Medical History:  Diagnosis Date   Abnormal Pap smear    GERD (gastroesophageal reflux disease)    HSV-2 (herpes simplex virus 2) infection    Hx of chlamydia infection    Hypertension    Labial abscess    Trichimoniasis 11/21/2016   Vaginal Pap smear, abnormal     Patient Active Problem List   Diagnosis Date Noted   Pelvic pain 02/11/2022   STD exposure 02/11/2022   Contraception management 02/11/2022   Encounter for gynecological examination with Papanicolaou smear of cervix 10/21/2020   Routine medical exam 10/21/2020   Tobacco abuse 09/22/2020   Encounter to establish care 09/22/2020   Hypertension 09/22/2020   Obesity (BMI 30.0-34.9) 09/22/2020   Marijuana use 12/10/2013   History of abnormal Pap smear 06/27/2013    Past Surgical History:  Procedure Laterality Date   CESAREAN SECTION     CESAREAN SECTION N/A 01/20/2014   Procedure: CESAREAN SECTION;  Surgeon: Allie Bossier, MD;  Location: [facility];  Service: Obstetrics;  Laterality: N/A;   DILATION AND CURETTAGE OF UTERUS     OOPHORECTOMY Right 01/20/2014   Procedure: OOPHORECTOMY;  Surgeon: Allie Bossier, MD;  Location: [facility];  Service: Obstetrics;  Laterality: Right;    OB History     Gravida  9   Para  3   Term  3   Preterm  0   AB  5   Living  3      SAB   2   IAB  2   Ectopic  1   Multiple      Live Births  3            Home Medications    Prior to Admission medications   Medication Sig Start Date End Date Taking? Authorizing Provider  amoxicillin-clavulanate (AUGMENTIN) 875-125 MG tablet Take 1 tablet by mouth every 12 (twelve) hours. 04/04/24  Yes Particia Nearing, PA-C  cetirizine (ZYRTEC ALLERGY) 10 MG tablet Take 1 tablet (10 mg total) by mouth daily. 04/04/24  Yes Particia Nearing, PA-C  fluticasone [facility]) 50 MCG/ACT nasal spray Place 1 spray into both nostrils 2 (two) times daily. 04/04/24  Yes Particia Nearing, PA-C  desogestrel-ethinyl estradiol (APRI) 0.15-30 MG-MCG tablet Take 1 tablet by mouth daily. 02/11/22   Hermina Staggers, MD  ibuprofen (ADVIL) 600 MG tablet Take 1 tablet (600 mg total) by mouth every 6 (six) hours as needed. 12/17/23   Burgess Amor, PA-C  metroNIDAZOLE (FLAGYL) 500 MG tablet Take 1 tablet (500 mg total) by mouth 2 (two) times daily. 12/17/23   Burgess Amor, PA-C  metroNIDAZOLE (FLAGYL) 500 MG tablet Take 1 tablet (500 mg total) by mouth 2 (two) times daily. 12/18/23   Achille Rich, PA-C  Multiple  Vitamins-Minerals (MULTIVITAMIN GUMMIES ADULT PO) Take by mouth.    [provider]    Family History Family History  Problem Relation Age of Onset   Diabetes Mother    Hypertension Mother    Thyroid disease Mother    Hypertension Father    Cancer Maternal Grandmother    Hypertension Paternal Grandmother    Diabetes Paternal Grandmother    Hypertension Paternal Grandfather    Diabetes Paternal Grandfather     Social History Social History   Tobacco Use   Smoking status: Former    Current packs/day: 0.00    Average packs/day: 0.5 packs/day for 3.0 years (1.5 ttl pk-yrs)    Types: Cigars, Cigarettes    Start date: 01/17/2018    Quit date: 01/17/2021    Years since quitting: 3.2   Smokeless tobacco: Never  Vaping Use   Vaping status: Never Used  Substance Use  Topics   Alcohol use: Not Currently    Comment: occasionally   Drug use: No     Allergies   Patient has no known allergies.   Review of Systems Review of Systems Per HPI  Physical Exam Triage Vital Signs ED Triage Vitals [04/04/24 1318]  Encounter Vitals Group     BP 138/66     Systolic BP Percentile      Diastolic BP Percentile      Pulse Rate 89     Resp 20     Temp 98.4 F (36.9 C)     Temp Source Oral     SpO2 99 %     Weight      Height      Head Circumference      Peak Flow      Pain Score 7     Pain Loc      Pain Education      Exclude from Growth Chart    No data found.  Updated Vital Signs BP 138/66 (BP Location: Right Arm)   Pulse 89   Temp 98.4 F (36.9 C) (Oral)   Resp 20   LMP 03/31/2024 (Approximate)   SpO2 99%   Breastfeeding No   Visual Acuity Right Eye Distance:   Left Eye Distance:   Bilateral Distance:    Right Eye Near:   Left Eye Near:    Bilateral Near:     Physical Exam Vitals and nursing note reviewed.  Constitutional:      Appearance: Normal appearance.  HENT:     Head: Atraumatic.     Right Ear: Tympanic membrane and external ear normal.     Left Ear: External ear normal.     Ears:     Comments: Mild left middle ear effusion    Nose: Congestion present.     Mouth/Throat:     Mouth: Mucous membranes are moist.     Pharynx: Posterior oropharyngeal erythema present.  Eyes:     Extraocular Movements: Extraocular movements intact.     Conjunctiva/sclera: Conjunctivae normal.  Cardiovascular:     Rate and Rhythm: Normal rate and regular rhythm.     Heart sounds: Normal heart sounds.  Pulmonary:     Effort: Pulmonary effort is normal.     Breath sounds: Normal breath sounds. No wheezing or rales.  Musculoskeletal:        General: Normal range of motion.     Cervical back: Normal range of motion and neck supple.  Skin:    General: Skin is warm and dry.  Neurological:  Mental Status: She is alert and oriented  to person, place, and time.  Psychiatric:        Mood and Affect: Mood normal.        Thought Content: Thought content normal.      UC Treatments / Results  Labs (all labs ordered are listed, but only abnormal results are displayed) Labs Reviewed - No data to display  EKG   Radiology No results found.  Procedures Procedures (including critical care time)  Medications Ordered in UC Medications - No data to display  Initial Impression / Assessment and Plan / UC Course  I have reviewed the triage vital signs and the nursing notes.  Pertinent labs & imaging results that were available during my care of the patient were reviewed by me and considered in my medical decision making (see chart for details).     Suspect uncontrolled seasonal allergies leading to sinus infection.  Zyrtec and Flonase daily for allergy control, Augmentin for sinusitis and discussed supportive over-the-counter medications, home care including sinus rinses.  Return for worsening symptoms.  Final Clinical Impressions(s) / UC Diagnoses   Final diagnoses:  Acute maxillary sinusitis, recurrence not specified  Seasonal allergic rhinitis due to other allergic trigger     Discharge Instructions      I have prescribed an allergy regimen for you to take daily consistently for seasonal allergy control to include Zyrtec and Flonase nasal spray.  I have also prescribed an antibiotic for a sinus infection and you may use cold and congestion medications as well as warm saline sinus rinses as needed.    ED Prescriptions     Medication Sig Dispense Auth. Provider   cetirizine (ZYRTEC ALLERGY) 10 MG tablet Take 1 tablet (10 mg total) by mouth daily. 30 tablet Particia Nearing, PA-C   fluticasone [facility]) 50 MCG/ACT nasal spray Place 1 spray into both nostrils 2 (two) times daily. 16 g Particia Nearing, PA-C   amoxicillin-clavulanate (AUGMENTIN) 875-125 MG tablet Take 1 tablet by mouth every 12  (twelve) hours. 14 tablet Particia Nearing, [place]      PDMP not reviewed this encounter.   Particia Nearing, [place] 04/04/24 1348

## 2024-07-31 ENCOUNTER — Other Ambulatory Visit (HOSPITAL_COMMUNITY)
Admission: RE | Admit: 2024-07-31 | Discharge: 2024-07-31 | Disposition: A | Discharge status: Home / Self Care | Source: Ambulatory Visit | Attending: Adult Health | Admitting: Adult Health

## 2024-07-31 ENCOUNTER — Encounter: Payer: Self-pay | Admitting: Adult Health

## 2024-07-31 ENCOUNTER — Ambulatory Visit: Admitting: Adult Health

## 2024-07-31 VITALS — BP 135/90 | HR 84 | Ht 61.0 in | Wt 170.0 lb

## 2024-07-31 DIAGNOSIS — Z3202 Encounter for pregnancy test, result negative: Secondary | ICD-10-CM | POA: Diagnosis not present

## 2024-07-31 DIAGNOSIS — B9689 Other specified bacterial agents as the cause of diseases classified elsewhere: Secondary | ICD-10-CM | POA: Diagnosis not present

## 2024-07-31 DIAGNOSIS — Z124 Encounter for screening for malignant neoplasm of cervix: Secondary | ICD-10-CM | POA: Insufficient documentation

## 2024-07-31 DIAGNOSIS — F17200 Nicotine dependence, unspecified, uncomplicated: Secondary | ICD-10-CM | POA: Diagnosis not present

## 2024-07-31 DIAGNOSIS — R030 Elevated blood-pressure reading, without diagnosis of hypertension: Secondary | ICD-10-CM | POA: Diagnosis not present

## 2024-07-31 DIAGNOSIS — N921 Excessive and frequent menstruation with irregular cycle: Secondary | ICD-10-CM

## 2024-07-31 DIAGNOSIS — N898 Other specified noninflammatory disorders of vagina: Secondary | ICD-10-CM | POA: Diagnosis not present

## 2024-07-31 DIAGNOSIS — N946 Dysmenorrhea, unspecified: Secondary | ICD-10-CM

## 2024-07-31 DIAGNOSIS — Z30011 Encounter for initial prescription of contraceptive pills: Secondary | ICD-10-CM

## 2024-07-31 LAB — POCT WET PREP (WET MOUNT)
Clue Cells Wet Prep Whiff POC: POSITIVE
WBC, Wet Prep HPF POC: POSITIVE

## 2024-07-31 LAB — POCT URINE PREGNANCY: Preg Test, Ur: NEGATIVE

## 2024-07-31 MED ORDER — NORETHINDRONE 0.35 MG PO TABS: 1.0000 | ORAL_TABLET | Freq: Every day | ORAL | 3 refills | Status: DC

## 2024-07-31 MED ORDER — METRONIDAZOLE 500 MG PO TABS: 500.0000 mg | ORAL_TABLET | Freq: Two times a day (BID) | ORAL | 0 refills | Status: DC

## 2024-07-31 NOTE — Progress Notes (Signed)
 Subjective:     Patient ID: Jamie [name], female   DOB: December 12, 1986, 38 y.o.   MRN: [id number]  HPI Jamie [name] is a 38 year old black female,single, [id number] in complaining of heavy periods lasting 5 days with 4 heavy, changing pads every 1.5-2 hours,has missed work before. Has cramping at times and has passed some clots. She needs a pap, too.  PCP is Dr Tobie  Review of Systems heavy periods lasting 5 days with 4 heavy, changing pads every 1.5-2 hours. Has cramping at times and has passed some clots.  Denies any migraine with aura, DVT,MI,stroke or breast cancer  Reviewed past medical,surgical, social and family history. Reviewed medications and allergies.     Objective:   Physical Exam BP (!) 135/90 (BP Location: Right Arm, Patient Position: Sitting, Cuff Size: Normal)   Pulse 84   Ht 5' 1 (1.549 m)   Wt 170 lb (77.1 kg)   LMP 07/20/2024 (Approximate)   BMI 32.12 kg/m  UPT is negative Skin warm and dry.Pelvic: external genitalia is normal in appearance no lesions, vagina: white discharge with odor,urethra has no lesions or masses noted, cervix:smooth and bulbous,pap with HR HPV genotyping, and GC/CHL,trich performed, uterus: normal size, shape and contour, non tender, no masses felt, adnexa: no masses or tenderness noted. Bladder is non tender and no masses felt. Wet prep was +Clue cells,WBCs and + whiff.      Upstream - 07/31/24 1050       Pregnancy Intention Screening   Does the patient want to become pregnant in the next year? No    Does the patient's partner want to become pregnant in the next year? No    Would the patient like to discuss contraceptive options today? Yes      Contraception Wrap Up   Current Method Abstinence    End Method Oral Contraceptive;Female Condom    Contraception Counseling Provided Yes    How was the end contraceptive method provided? Prescription         Examination chaperoned by Clarita Salt LPN  Assessment:     1. Negative pregnancy  test - POCT urine pregnancy  2. Routine Papanicolaou smear Pap sent Pap in 3 years if normal  - Cytology - PAP( )  3. Menorrhagia with irregular cycle (Primary) heavy periods lasting 5 days with 4 heavy, changing pads every 1.5-2 hours. Has cramping at times and has passed some clots. She had normal US  12/18/23 Will rx micronor  to see if helps   4. Dysmenorrhea Wil try micronor    5. BV (bacterial vaginosis) Rx flagyl , no sex  or alcohol while taking  - POCT Wet Prep [employer])  6. Vaginal odor +BV ,rx flagyl   - POCT Wet Prep [facility])  7. Encounter for initial prescription of contraceptive pills Will try micronor , rx sent Meds ordered this encounter  Medications   metroNIDAZOLE  (FLAGYL ) 500 MG tablet    Sig: Take 1 tablet (500 mg total) by mouth 2 (two) times daily.    Dispense:  14 tablet    Refill:  0    Supervising Provider:   JAYNE MINDER H [[id number]]   norethindrone  (MICRONOR ) 0.35 MG tablet    Sig: Take 1 tablet (0.35 mg total) by mouth daily.    Dispense:  84 tablet    Refill:  3    Supervising Provider:   JAYNE, LUTHER H [[id number]]     8. Elevated BP without diagnosis of hypertension Keep check on BP  9. Smoker  Plan:     Follow up in 3 months for ROS on bleeding and BP check

## 2024-08-05 LAB — CYTOLOGY - PAP
Adequacy: ABSENT
Chlamydia: NEGATIVE
Comment: NEGATIVE
Comment: NEGATIVE
Comment: NEGATIVE
Comment: NORMAL
Diagnosis: NEGATIVE
High risk HPV: NEGATIVE
Neisseria Gonorrhea: NEGATIVE
Trichomonas: NEGATIVE

## 2024-08-06 ENCOUNTER — Ambulatory Visit: Payer: Self-pay | Admitting: Adult Health

## 2024-10-31 ENCOUNTER — Other Ambulatory Visit (HOSPITAL_COMMUNITY)
Admission: RE | Admit: 2024-10-31 | Discharge: 2024-10-31 | Disposition: A | Discharge status: Home / Self Care | Source: Ambulatory Visit | Attending: Adult Health | Admitting: Adult Health

## 2024-10-31 ENCOUNTER — Encounter: Payer: Self-pay | Admitting: Adult Health

## 2024-10-31 ENCOUNTER — Ambulatory Visit (INDEPENDENT_AMBULATORY_CARE_PROVIDER_SITE_OTHER): Admitting: Adult Health

## 2024-10-31 VITALS — BP 143/90 | HR 89 | Ht 61.0 in | Wt 164.0 lb

## 2024-10-31 DIAGNOSIS — N921 Excessive and frequent menstruation with irregular cycle: Secondary | ICD-10-CM | POA: Diagnosis not present

## 2024-10-31 DIAGNOSIS — Z113 Encounter for screening for infections with a predominantly sexual mode of transmission: Secondary | ICD-10-CM | POA: Diagnosis not present

## 2024-10-31 DIAGNOSIS — F17200 Nicotine dependence, unspecified, uncomplicated: Secondary | ICD-10-CM

## 2024-10-31 DIAGNOSIS — Z3041 Encounter for surveillance of contraceptive pills: Secondary | ICD-10-CM

## 2024-10-31 DIAGNOSIS — N898 Other specified noninflammatory disorders of vagina: Secondary | ICD-10-CM

## 2024-10-31 DIAGNOSIS — Z3202 Encounter for pregnancy test, result negative: Secondary | ICD-10-CM | POA: Diagnosis not present

## 2024-10-31 DIAGNOSIS — R030 Elevated blood-pressure reading, without diagnosis of hypertension: Secondary | ICD-10-CM | POA: Diagnosis not present

## 2024-10-31 LAB — POCT URINE PREGNANCY: Preg Test, Ur: NEGATIVE

## 2024-10-31 MED ORDER — SLYND 4 MG PO TABS: 1.0000 | ORAL_TABLET | Freq: Every day | ORAL | Status: DC

## 2024-10-31 NOTE — Progress Notes (Signed)
 Subjective:     Patient ID: Jamie [name], female   DOB: 1986/06/30, 38 y.o.   MRN: [id number]  HPI Jamie [name] is a 38 year old black female,single, [id number] in having stopped Micronor  due to cramping and wants to try [facility], to help with heavy periods.(Her sister likes Slynd) She has vaginal discharge with some itching and odor.     Component Value Date/Time   DIAGPAP  07/31/2024 1054    - Negative for intraepithelial lesion or malignancy (NILM)   DIAGPAP  10/21/2020 0948    - Negative for intraepithelial lesion or malignancy (NILM)   DIAGPAP  08/17/2017 0000    NEGATIVE FOR INTRAEPITHELIAL LESIONS OR MALIGNANCY.   HPVHIGH Negative 07/31/2024 1054   HPVHIGH Negative 10/21/2020 0948   ADEQPAP  07/31/2024 1054    Satisfactory for evaluation; transformation zone component ABSENT.   ADEQPAP  10/21/2020 0948    Satisfactory for evaluation; transformation zone component PRESENT.   ADEQPAP  08/17/2017 0000    Satisfactory for evaluation  endocervical/transformation zone component PRESENT.    PCP is Dr Tobie  Review of Systems +heavy periods +cramping Had sharp pain in right side, none now +vaginal discharge with odor +vaginal itching Has had sex, no new partners  Reviewed past medical,surgical, social and family history. Reviewed medications and allergies.     Objective:   Physical Exam BP (!) 143/90 (BP Location: Left Arm, Patient Position: Sitting, Cuff Size: Normal)   Pulse 89   Ht 5' 1 (1.549 m)   Wt 164 lb (74.4 kg)   LMP 10/13/2024 (Approximate)   BMI 30.99 kg/m  UPT is negative Skin warm and dry.Pelvic: external genitalia is normal in appearance no lesions, vagina: white discharge with odor,urethra has no lesions or masses noted, cervix:smooth and bulbous, uterus: normal size, shape and contour, non tender, no masses felt, adnexa: no masses or tenderness noted. Bladder is non tender and no masses felt. CV swab obtained.   Upstream - 10/31/24 1106       Pregnancy  Intention Screening   Does the patient want to become pregnant in the next year? No    Does the patient's partner want to become pregnant in the next year? No    Would the patient like to discuss contraceptive options today? Yes      Contraception Wrap Up   Current Method Female Condom    End Method Oral Contraceptive    Contraception Counseling Provided Yes         Examination chaperoned by Clarita Salt LPN     Assessment:     1. Negative pregnancy test - POCT urine pregnancy  2. Vaginal odor +odor CV swab sent  - Cervicovaginal ancillary only( [facility])  3. Vaginal discharge (Primary) +white discharge with odor CV swab sent  - Cervicovaginal ancillary only( [place])  4. Encounter for surveillance of contraceptive pills Gave 3 packs of Slynd, can start today, take 1 daily and follow up in 3 months for ROS Meds ordered this encounter  Medications   Drospirenone (SLYND) 4 MG TABS    Sig: Take 1 tablet (4 mg total) by mouth daily.    Supervising Provider:   JAYNE MINDER H [[id number]]     5. Menorrhagia with irregular cycle Will try slynd to control periods   6. Screening examination for STD (sexually transmitted disease) CV swab sent for GC/CHL,trich,BV and yeast Declines HIV and RPR - Cervicovaginal ancillary only( [place])  7. Elevated BP without diagnosis of hypertension Keep check  on BP and follow up with PCP  8. Smoker    Plan:     Follow up in 3 months

## 2024-11-01 LAB — CERVICOVAGINAL ANCILLARY ONLY
Bacterial Vaginitis (gardnerella): POSITIVE — AB
Candida Glabrata: NEGATIVE
Candida Vaginitis: NEGATIVE
Chlamydia: NEGATIVE
Comment: NEGATIVE
Comment: NEGATIVE
Comment: NEGATIVE
Comment: NEGATIVE
Comment: NEGATIVE
Comment: NORMAL
Neisseria Gonorrhea: NEGATIVE
Trichomonas: NEGATIVE

## 2024-11-04 ENCOUNTER — Ambulatory Visit: Payer: Self-pay | Admitting: Adult Health

## 2024-11-04 MED ORDER — METRONIDAZOLE 500 MG PO TABS: 500.0000 mg | ORAL_TABLET | Freq: Two times a day (BID) | ORAL | 0 refills | Status: AC

## 2024-11-07 ENCOUNTER — Ambulatory Visit: Payer: Self-pay | Admitting: Family Medicine

## 2024-11-07 ENCOUNTER — Encounter: Payer: Self-pay | Admitting: Family Medicine

## 2024-11-07 VITALS — BP 135/81 | HR 105 | Ht 61.0 in | Wt 165.0 lb

## 2024-11-07 DIAGNOSIS — E782 Mixed hyperlipidemia: Secondary | ICD-10-CM | POA: Diagnosis not present

## 2024-11-07 DIAGNOSIS — E559 Vitamin D deficiency, unspecified: Secondary | ICD-10-CM | POA: Diagnosis not present

## 2024-11-07 DIAGNOSIS — R232 Flushing: Secondary | ICD-10-CM | POA: Diagnosis not present

## 2024-11-07 DIAGNOSIS — Z23 Encounter for immunization: Secondary | ICD-10-CM | POA: Diagnosis not present

## 2024-11-07 DIAGNOSIS — N289 Disorder of kidney and ureter, unspecified: Secondary | ICD-10-CM

## 2024-11-07 DIAGNOSIS — R7301 Impaired fasting glucose: Secondary | ICD-10-CM

## 2024-11-07 DIAGNOSIS — E038 Other specified hypothyroidism: Secondary | ICD-10-CM

## 2024-11-07 NOTE — Patient Instructions (Addendum)
 I appreciate the opportunity to provide care to you today!    Follow up:  5 months  Fasting Labs: please stop by the lab during the week to get your blood drawn (CBC, CMP, TSH, Lipid profile, HgA1c, Vit D)  Renal lesion -A renal ultrasound has been ordered to follow up on the previously completed imaging study. -Please monitor for any new or worsening symptoms such as flank pain, blood in the urine, fever, changes in urination, or swelling, and seek medical attention if these occur. -Further recommendations will be made once ultrasound results are reviewed.  Hot Flashes -You may take OTC Estroven Complete to help manage hot flashes. -I recommend maintaining good hydration, avoiding triggers such as caffeine, spicy foods, and alcohol, and practicing stress-reduction techniques (deep breathing, cooling at night, loose clothing). -Encourage regular physical activity, which may also reduce symptom severity.  Monitor for any worsening symptoms or new concerns and follow up as needed.   Please continue to a heart-healthy diet and increase your physical activities. Try to exercise for at least five days a week.    It was a pleasure to see you and I look forward to continuing to work together on your health and well-being. Please do not hesitate to call the office if you need care or have questions about your care.  In case of emergency, please visit the Emergency Department for urgent care, or contact our clinic at [phone] to schedule an appointment. We're here to help you!   Have a wonderful day and week. With Gratitude, Meade JENEANE Gerlach MSN, FNP-BC, PMHNP-BC

## 2024-11-07 NOTE — Assessment & Plan Note (Signed)
 Encouraged to start taking OTC Estroven Complete to help manage hot flashes. -I recommend maintaining good hydration, avoiding triggers such as caffeine, spicy foods, and alcohol, and practicing stress-reduction techniques (deep breathing, cooling at night, loose clothing). -Encourage regular physical activity, which may also reduce symptom severity.  Monitor for any worsening symptoms or new concerns and follow up as needed.

## 2024-11-07 NOTE — Assessment & Plan Note (Signed)
 A renal ultrasound has been ordered to follow up on the previously completed imaging study. -Encouraged to monitor for any new or worsening symptoms such as flank pain, blood in the urine, fever, changes in urination, or swelling, and seek medical attention if these occur. -Further recommendations will be made once ultrasound results are reviewed.

## 2024-11-07 NOTE — Assessment & Plan Note (Signed)
 Patient educated on CDC recommendation for the vaccine. Verbal consent was obtained from the patient, vaccine administered by nurse, no sign of adverse reactions noted at this time. Patient education on arm soreness and use of tylenol or ibuprofen for this patient  was discussed. Patient educated on the signs and symptoms of adverse effect and advise to contact the office if they occur.

## 2024-11-07 NOTE — Progress Notes (Signed)
 New Patient Office Visit  Subjective:  Patient ID: Jamie [name], female    DOB: [birth year]  Age: 38 y.o. MRN: [id number]  CC:  Chief Complaint  Patient presents with   Establish Care   Hot Flashes    Off and on hot flashes     HPI Jamie [name] is a 38 y.o. female with past medical history of  HTN, Obesity,  presents for establishing care.   The patient reports experiencing hot flash symptoms that began yesterday. She notes a family history of early menopause, as her mother began experiencing menopausal symptoms in her early 62s. The patient states that her symptoms have already improved today, and she has no additional complaints at this time.  The patient also requests follow-up regarding the renal lesion identified on imaging dated 12/18/2023. She is currently asymptomatic, denying flank pain, urinary symptoms, fever, hematuria, or other related concerns. She would like to proceed with the recommended follow-up evaluation for the renal lesion   Past Medical History:  Diagnosis Date   Abnormal Pap smear    GERD (gastroesophageal reflux disease)    HSV-2 (herpes simplex virus 2) infection    Hx of chlamydia infection    Hypertension    Labial abscess    Trichimoniasis 11/21/2016   Vaginal Pap smear, abnormal     Past Surgical History:  Procedure Laterality Date   CESAREAN SECTION     CESAREAN SECTION N/A 01/20/2014   Procedure: CESAREAN SECTION;  Surgeon: Harland JAYSON Birkenhead, MD;  Location: [facility];  Service: Obstetrics;  Laterality: N/A;   DILATION AND CURETTAGE OF UTERUS     OOPHORECTOMY Right 01/20/2014   Procedure: OOPHORECTOMY;  Surgeon: Harland JAYSON Birkenhead, MD;  Location: [facility];  Service: Obstetrics;  Laterality: Right;    Family History  Problem Relation Age of Onset   Diabetes Mother    Hypertension Mother    Thyroid disease Mother    Hypertension Father    Cancer Maternal Grandmother    Hypertension Paternal Grandmother    Diabetes Paternal Grandmother     Hypertension Paternal Grandfather    Diabetes Paternal Grandfather     Social History   Socioeconomic History   Marital status: Single    Spouse name: Not on file   Number of children: Not on file   Years of education: Not on file   Highest education level: 12th grade  Occupational History   Not on file  Tobacco Use   Smoking status: Some Days    Types: Cigars   Smokeless tobacco: Never  Vaping Use   Vaping status: Never Used  Substance and Sexual Activity   Alcohol use: Yes    Comment: occasionally   Drug use: No   Sexual activity: Yes    Partners: Female    Birth control/protection: Condom, Pill  Other Topics Concern   Not on file  Social History Narrative   Not on file   Social Drivers of Health   Financial Resource Strain: Low Risk  (11/06/2024)   Overall Financial Resource Strain (CARDIA)    Difficulty of Paying Living Expenses: Not hard at all  Food Insecurity: No Food Insecurity (11/06/2024)   Hunger Vital Sign    Worried About Running Out of Food in the Last Year: Never true    Ran Out of Food in the Last Year: Never true  Transportation Needs: No Transportation Needs (11/06/2024)   PRAPARE - Administrator, Civil Service (Medical): No  Lack of Transportation (Non-Medical): No  Physical Activity: Insufficiently Active (11/06/2024)   Exercise Vital Sign    Days of Exercise per Week: 3 days    Minutes of Exercise per Session: 20 min  Stress: No Stress Concern Present (11/06/2024)   [employer] of Occupational Health - Occupational Stress Questionnaire    Feeling of Stress: Only a little  Social Connections: Moderately Isolated (11/06/2024)   Social Connection and Isolation Panel    Frequency of Communication with Friends and Family: More than three times a week    Frequency of Social Gatherings with Friends and Family: Once a week    Attends Religious Services: 1 to 4 times per year    Active Member of [employer] or Organizations: No     Attends Engineer, Structural: Not on file    Marital Status: Divorced  Intimate Partner Violence: Not At Risk (10/21/2020)   Humiliation, Afraid, Rape, and Kick questionnaire    Fear of Current or Ex-Partner: No    Emotionally Abused: No    Physically Abused: No    Sexually Abused: No    ROS Review of Systems  Constitutional:  Negative for chills and fever.  Eyes:  Negative for visual disturbance.  Respiratory:  Negative for chest tightness and shortness of breath.   Neurological:  Negative for dizziness and headaches.    Objective:   Today's Vitals: BP 135/81   Pulse (!) 105   Ht 5' 1 (1.549 m)   Wt 165 lb (74.8 kg)   LMP 10/13/2024 (Approximate)   SpO2 98%   BMI 31.18 kg/m   Physical Exam HENT:     Head: Normocephalic.     Mouth/Throat:     Mouth: Mucous membranes are moist.  Cardiovascular:     Rate and Rhythm: Normal rate.     Heart sounds: Normal heart sounds.  Pulmonary:     Effort: Pulmonary effort is normal.     Breath sounds: Normal breath sounds.  Neurological:     Mental Status: She is alert.      Assessment & Plan:   Renal lesion Assessment & Plan: A renal ultrasound has been ordered to follow up on the previously completed imaging study. -Encouraged to monitor for any new or worsening symptoms such as flank pain, blood in the urine, fever, changes in urination, or swelling, and seek medical attention if these occur. -Further recommendations will be made once ultrasound results are reviewed.  Orders: -     US  RENAL  Hot flashes Assessment & Plan: Encouraged to start taking OTC Estroven Complete to help manage hot flashes. -I recommend maintaining good hydration, avoiding triggers such as caffeine, spicy foods, and alcohol, and practicing stress-reduction techniques (deep breathing, cooling at night, loose clothing). -Encourage regular physical activity, which may also reduce symptom severity.  Monitor for any worsening symptoms or  new concerns and follow up as needed.   Encounter for immunization Assessment & Plan: Patient educated on CDC recommendation for the vaccine. Verbal consent was obtained from the patient, vaccine administered by nurse, no sign of adverse reactions noted at this time. Patient education on arm soreness and use of tylenol  or ibuprofen  for this patient  was discussed. Patient educated on the signs and symptoms of adverse effect and advise to contact the office if they occur.   Orders: -     Flu vaccine trivalent PF, 6mos and older(Flulaval,Afluria,Fluarix,Fluzone) -     Tdap vaccine greater than or equal to 7yo IM  IFG (  impaired fasting glucose) -     Hemoglobin A1c  Vitamin D deficiency -     VITAMIN D 25 Hydroxy (Vit-D Deficiency, Fractures)  TSH (thyroid-stimulating hormone deficiency) -     TSH + free T4  Mixed hyperlipidemia -     Lipid panel -     CMP14+EGFR -     CBC with Differential/Platelet    Note: This chart has been completed using Engineer, Civil (consulting) software, and while attempts have been made to ensure accuracy, certain words and phrases may not be transcribed as intended.   Follow-up: Return in about 5 months (around 04/07/2025).   Chea Malan  Z Bacchus, FNP

## 2024-11-20 ENCOUNTER — Ambulatory Visit (HOSPITAL_COMMUNITY)

## 2024-11-26 ENCOUNTER — Ambulatory Visit (HOSPITAL_COMMUNITY)
Admission: RE | Admit: 2024-11-26 | Discharge: 2024-11-26 | Disposition: A | Discharge status: Home / Self Care | Source: Ambulatory Visit | Attending: Family Medicine | Admitting: Family Medicine

## 2024-11-26 DIAGNOSIS — N289 Disorder of kidney and ureter, unspecified: Secondary | ICD-10-CM | POA: Insufficient documentation

## 2024-12-04 ENCOUNTER — Ambulatory Visit: Payer: Self-pay | Admitting: Family Medicine

## 2025-01-21 ENCOUNTER — Encounter: Payer: Self-pay | Admitting: Adult Health

## 2025-01-21 ENCOUNTER — Ambulatory Visit: Admitting: Adult Health

## 2025-01-21 VITALS — BP 131/81 | HR 73 | Ht 61.0 in | Wt 170.0 lb

## 2025-01-21 DIAGNOSIS — N946 Dysmenorrhea, unspecified: Secondary | ICD-10-CM | POA: Diagnosis not present

## 2025-01-21 DIAGNOSIS — Z3041 Encounter for surveillance of contraceptive pills: Secondary | ICD-10-CM

## 2025-01-21 DIAGNOSIS — N921 Excessive and frequent menstruation with irregular cycle: Secondary | ICD-10-CM | POA: Diagnosis not present

## 2025-01-21 DIAGNOSIS — Z793 Long term (current) use of hormonal contraceptives: Secondary | ICD-10-CM

## 2025-01-21 MED ORDER — SLYND 4 MG PO TABS: 1.0000 | ORAL_TABLET | Freq: Every day | ORAL | 12 refills | Status: AC

## 2025-01-21 NOTE — Progress Notes (Signed)
" °  Subjective:     Patient ID: Jamie [name], female   DOB: 1986/04/22, 39 y.o.   MRN: [id number]  HPI Jamie [name] is a 39 year old black female, single, [id number] in for follow up in taking slynd ,and likes it. Periods are lighter and less painful, has not had one for January yet.     Component Value Date/Time   DIAGPAP  07/31/2024 1054    - Negative for intraepithelial lesion or malignancy (NILM)   DIAGPAP  10/21/2020 0948    - Negative for intraepithelial lesion or malignancy (NILM)   DIAGPAP  08/17/2017 0000    NEGATIVE FOR INTRAEPITHELIAL LESIONS OR MALIGNANCY.   HPVHIGH Negative 07/31/2024 1054   HPVHIGH Negative 10/21/2020 0948   ADEQPAP  07/31/2024 1054    Satisfactory for evaluation; transformation zone component ABSENT.   ADEQPAP  10/21/2020 0948    Satisfactory for evaluation; transformation zone component PRESENT.   ADEQPAP  08/17/2017 0000    Satisfactory for evaluation  endocervical/transformation zone component PRESENT.   PCP is Jamie Gerlach NP  Review of Systems Periods lighter Periods less painful Reviewed past medical,surgical, social and family history. Reviewed medications and allergies.     Objective:   Physical Exam BP 131/81 (BP Location: Right Arm, Patient Position: Sitting, Cuff Size: Normal)   Pulse 73   Ht 5' 1 (1.549 m)   Wt 170 lb (77.1 kg)   LMP 12/15/2024 (Approximate)   BMI 32.12 kg/m     Skin warm and dry.  Lungs: clear to ausculation bilaterally. Cardiovascular: regular rate and rhythm.   Upstream - 01/21/25 1009       Pregnancy Intention Screening   Does the patient want to become pregnant in the next year? No    Does the patient's partner want to become pregnant in the next year? No    Would the patient like to discuss contraceptive options today? No      Contraception Wrap Up   Current Method Oral Contraceptive    End Method Oral Contraceptive    Contraception Counseling Provided No          Assessment:     1. Encounter  for surveillance of contraceptive pills (Primary) Refill slynd   Meds ordered this encounter  Medications   Drospirenone  (SLYND ) 4 MG TABS    Sig: Take 1 tablet (4 mg total) by mouth daily.    Dispense:  28 tablet    Refill:  12    Supervising Provider:   JAYNE MINDER H [[id number]]     2. Menorrhagia with irregular cycle Periods are lighter  3. Dysmenorrhea Periods less painful just slight cramping    Plan:     Follow up in 1 year for physical     "

## 2025-01-31 ENCOUNTER — Ambulatory Visit: Admitting: Adult Health

## 2025-04-07 ENCOUNTER — Ambulatory Visit: Admitting: Family Medicine
